# Patient Record
Sex: Female | Born: 2000
Health system: Southern US, Community
[De-identification: ages and names within clinical notes are randomized; demographics above are authoritative.]

## PROBLEM LIST (undated history)

## (undated) DIAGNOSIS — R51 Headache: Secondary | ICD-10-CM

## (undated) DIAGNOSIS — G479 Sleep disorder, unspecified: Secondary | ICD-10-CM

## (undated) DIAGNOSIS — F32A Depression, unspecified: Secondary | ICD-10-CM

## (undated) DIAGNOSIS — F450 Somatization disorder: Secondary | ICD-10-CM

## (undated) DIAGNOSIS — J309 Allergic rhinitis, unspecified: Secondary | ICD-10-CM

## (undated) DIAGNOSIS — K5900 Constipation, unspecified: Secondary | ICD-10-CM

## (undated) DIAGNOSIS — R519 Headache, unspecified: Secondary | ICD-10-CM

## (undated) DIAGNOSIS — F329 Major depressive disorder, single episode, unspecified: Secondary | ICD-10-CM

## (undated) DIAGNOSIS — L209 Atopic dermatitis, unspecified: Secondary | ICD-10-CM

## (undated) DIAGNOSIS — F419 Anxiety disorder, unspecified: Secondary | ICD-10-CM

## (undated) HISTORY — DX: Atopic dermatitis, unspecified: L20.9

## (undated) HISTORY — DX: Allergic rhinitis, unspecified: J30.9

## (undated) HISTORY — DX: Anxiety disorder, unspecified: F41.9

## (undated) HISTORY — DX: Major depressive disorder, single episode, unspecified: F32.9

## (undated) HISTORY — PX: TONSILLECTOMY: SUR1361

## (undated) HISTORY — DX: Headache, unspecified: R51.9

## (undated) HISTORY — DX: Sleep disorder, unspecified: G47.9

## (undated) HISTORY — DX: Headache: R51

## (undated) HISTORY — DX: Depression, unspecified: F32.A

## (undated) HISTORY — DX: Constipation, unspecified: K59.00

## (undated) HISTORY — DX: Somatization disorder: F45.0

---
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
## 2018-01-07 ENCOUNTER — Ambulatory Visit (INDEPENDENT_AMBULATORY_CARE_PROVIDER_SITE_OTHER): Payer: BLUE CROSS/BLUE SHIELD | Admitting: Clinical

## 2018-01-07 DIAGNOSIS — F4011 Social phobia, generalized: Secondary | ICD-10-CM | POA: Diagnosis not present

## 2018-01-20 ENCOUNTER — Ambulatory Visit (INDEPENDENT_AMBULATORY_CARE_PROVIDER_SITE_OTHER): Payer: BLUE CROSS/BLUE SHIELD | Admitting: Clinical

## 2018-01-20 DIAGNOSIS — F4011 Social phobia, generalized: Secondary | ICD-10-CM

## 2018-02-04 ENCOUNTER — Ambulatory Visit (INDEPENDENT_AMBULATORY_CARE_PROVIDER_SITE_OTHER): Payer: BLUE CROSS/BLUE SHIELD | Admitting: Clinical

## 2018-02-04 ENCOUNTER — Encounter: Payer: Self-pay | Admitting: Family Medicine

## 2018-02-04 ENCOUNTER — Ambulatory Visit (INDEPENDENT_AMBULATORY_CARE_PROVIDER_SITE_OTHER): Payer: BLUE CROSS/BLUE SHIELD | Admitting: Family Medicine

## 2018-02-04 VITALS — BP 133/82 | HR 56 | Temp 98.5°F | Resp 20 | Ht 65.0 in | Wt 115.0 lb

## 2018-02-04 DIAGNOSIS — F411 Generalized anxiety disorder: Secondary | ICD-10-CM

## 2018-02-04 DIAGNOSIS — F4011 Social phobia, generalized: Secondary | ICD-10-CM | POA: Diagnosis not present

## 2018-02-04 DIAGNOSIS — F1290 Cannabis use, unspecified, uncomplicated: Secondary | ICD-10-CM

## 2018-02-04 MED ORDER — SERTRALINE HCL 100 MG PO TABS: 200.0000 mg | ORAL_TABLET | Freq: Every day | ORAL | 1 refills | Status: DC

## 2018-02-04 NOTE — Patient Instructions (Signed)
I am glad you are doing well.  Follow-up in 6 months. Make sure to read the handouts. Follow-up if you have any questions or concerns.   Have fun at the Wedding!!   Generalized Anxiety Disorder, Adult Generalized anxiety disorder (GAD) is a mental health disorder. People with this condition constantly worry about everyday events. Unlike normal anxiety, worry related to GAD is not triggered by a specific event. These worries also do not fade or get better with time. GAD interferes with life functions, including relationships, work, and school. GAD can vary from mild to severe. People with severe GAD can have intense waves of anxiety with physical symptoms (panic attacks). What are the causes? The exact cause of GAD is not known. What increases the risk? This condition is more likely to develop in:  Women.  People who have a family history of anxiety disorders.  People who are very shy.  People who experience very stressful life events, such as the death of a loved one.  People who have a very stressful family environment.  What are the signs or symptoms? People with GAD often worry excessively about many things in their lives, such as their health and family. They may also be overly concerned about:  Doing well at work.  Being on time.  Natural disasters.  Friendships.  Physical symptoms of GAD include:  Fatigue.  Muscle tension or having muscle twitches.  Trembling or feeling shaky.  Being easily startled.  Feeling like your heart is pounding or racing.  Feeling out of breath or like you cannot take a deep breath.  Having trouble falling asleep or staying asleep.  Sweating.  Nausea, diarrhea, or irritable bowel syndrome (IBS).  Headaches.  Trouble concentrating or remembering facts.  Restlessness.  Irritability.  How is this diagnosed? Your health care provider can diagnose GAD based on your symptoms and medical history. You will also have a physical  exam. The health care provider will ask specific questions about your symptoms, including how severe they are, when they started, and if they come and go. Your health care provider may ask you about your use of alcohol or drugs, including prescription medicines. Your health care provider may refer you to a mental health specialist for further evaluation. Your health care provider will do a thorough examination and may perform additional tests to rule out other possible causes of your symptoms. To be diagnosed with GAD, a person must have anxiety that:  Is out of his or her control.  Affects several different aspects of his or her life, such as work and relationships.  Causes distress that makes him or her unable to take part in normal activities.  Includes at least three physical symptoms of GAD, such as restlessness, fatigue, trouble concentrating, irritability, muscle tension, or sleep problems.  Before your health care provider can confirm a diagnosis of GAD, these symptoms must be present more days than they are not, and they must last for six months or longer. How is this treated? The following therapies are usually used to treat GAD:  Medicine. Antidepressant medicine is usually prescribed for long-term daily control. Antianxiety medicines may be added in severe cases, especially when panic attacks occur.  Talk therapy (psychotherapy). Certain types of talk therapy can be helpful in treating GAD by providing support, education, and guidance. Options include: ? Cognitive behavioral therapy (CBT). People learn coping skills and techniques to ease their anxiety. They learn to identify unrealistic or negative thoughts and behaviors and to replace  them with positive ones. ? Acceptance and commitment therapy (ACT). This treatment teaches people how to be mindful as a way to cope with unwanted thoughts and feelings. ? Biofeedback. This process trains you to manage your body's response  (physiological response) through breathing techniques and relaxation methods. You will work with a therapist while machines are used to monitor your physical symptoms.  Stress management techniques. These include yoga, meditation, and exercise.  A mental health specialist can help determine which treatment is best for you. Some people see improvement with one type of therapy. However, other people require a combination of therapies. Follow these instructions at home:  Take over-the-counter and prescription medicines only as told by your health care provider.  Try to maintain a normal routine.  Try to anticipate stressful situations and allow extra time to manage them.  Practice any stress management or self-calming techniques as taught by your health care provider.  Do not punish yourself for setbacks or for not making progress.  Try to recognize your accomplishments, even if they are small.  Keep all follow-up visits as told by your health care provider. This is important. Contact a health care provider if:  Your symptoms do not get better.  Your symptoms get worse.  You have signs of depression, such as: ? A persistently sad, cranky, or irritable mood. ? Loss of enjoyment in activities that used to bring you joy. ? Change in weight or eating. ? Changes in sleeping habits. ? Avoiding friends or family members. ? Loss of energy for normal tasks. ? Feelings of guilt or worthlessness. Get help right away if:  You have serious thoughts about hurting yourself or others. If you ever feel like you may hurt yourself or others, or have thoughts about taking your own life, get help right away. You can go to your nearest emergency department or call:  Your local emergency services (911 in the [place]  A suicide crisis helpline, such as the National Suicide Prevention Lifeline at 404-470-30791-223 114 6077. This is open 24 hours a day.  Summary  Generalized anxiety disorder (GAD) is a mental  health disorder that involves worry that is not triggered by a specific event.  People with GAD often worry excessively about many things in their lives, such as their health and family.  GAD may cause physical symptoms such as restlessness, trouble concentrating, sleep problems, frequent sweating, nausea, diarrhea, headaches, and trembling or muscle twitching.  A mental health specialist can help determine which treatment is best for you. Some people see improvement with one type of therapy. However, other people require a combination of therapies. This information is not intended to replace advice given to you by your health care provider. Make sure you discuss any questions you have with your health care provider. Document Released: 02/08/2013 Document Revised: 09/03/2016 Document Reviewed: 09/03/2016 Elsevier Interactive Patient Education  [employer]2018 [employer]

## 2018-02-04 NOTE — Progress Notes (Signed)
Patricia Willis , [birth year], 17 y.o., female MRN: [id number] Patient Care Team    Relationship Specialty Notifications Start End  Natalia Leatherwood, DO PCP - General Family Medicine  06/01/15     Chief Complaint  Patient presents with  . Anxiety     Subjective:  Anxiety:  Patient presents today for follow-up on her anxiety. She is doing rather well. She is seeing Dr. Gery Pray for counseling. She wants [place] to be aware she has been smoking mariajuana daily. She is doing well on Zoloft 200 mg Qd. She is having no side effects.  She reports she is "terrified " to try and get another job. She currently works for her parents. She has a great deal of social phobia and isn't active outside of work.   Prior note:  Presents for follow-up on her anxiety today. She states that she has been taking the 150 mg of Zoloft daily. She denies any negative side effects from the medication. She reports increased anxiety currently secondary to a relationship. She has been dating a young man for approximately one year, and she is having problems in that relationship. She has also graduated early this year in December, but will not walk with her class until June. She currently does not have a job and is deciding if she wants to attend college. She states she is not a "school type "of person and really does not want to attend college. She also states she would like to be involved in exercise science/physiology.  Depression screen [facility] 2/9 09/15/2017 02/11/2017  Decreased Interest 1 1  Down, Depressed, Hopeless 3 3  PHQ - 2 Score 4 4  Altered sleeping 0 3  Tired, decreased energy 1 3  Change in appetite 1 1  Feeling bad or failure about yourself  2 3  Trouble concentrating 0 0  Moving slowly or fidgety/restless 0 0  Suicidal thoughts 0 0  PHQ-9 Score 8 14  Difficult doing work/chores - Somewhat difficult    Allergies  Allergen Reactions  . Keflex [Cephalexin]    Social History   Tobacco Use  . Smoking  status: Never Smoker  . Smokeless tobacco: Never Used  Substance Use Topics  . Alcohol use: No   Past Medical History:  Diagnosis Date  . Allergic rhinitis   . Anxiety with somatization   . Atopic dermatitis   . Constipation   . Depression   . Headache   . Sleep disturbances    Past Surgical History:  Procedure Laterality Date  . TONSILLECTOMY     Family History  Problem Relation Age of Onset  . Hypertension Maternal Grandmother    Allergies as of 02/04/2018      Reactions   Keflex [cephalexin]       Medication List        Accurate as of 02/04/18  2:19 PM. Always use your most recent med list.          CRYSELLE-28 0.3-30 MG-MCG tablet Generic drug:  norgestrel-ethinyl estradiol   gabapentin 300 MG capsule Commonly known as:  NEURONTIN Take 600 mg by mouth at bedtime.   hydrocortisone 2.5 % cream Apply topically 2 (two) times daily as needed.   magnesium oxide 400 MG tablet Commonly known as:  MAG-OX Take 400 mg by mouth.   sertraline 100 MG tablet Commonly known as:  ZOLOFT Take 2 tablets (200 mg total) by mouth daily. Need office visit prior to anymore refills.  All past medical history, surgical history, allergies, family history, immunizations andmedications were updated in the EMR today and reviewed under the history and medication portions of their EMR.     ROS: Negative, with the exception of above mentioned in HPI   Objective:  BP 133/82 (BP Location: Left Arm, Patient Position: Sitting, Cuff Size: Normal)   Pulse (!) 56   Temp 98.5 F (36.9 C)   Resp 20   Ht 5\' 5"  (1.651 m)   Wt 115 lb (52.2 kg)   LMP 01/27/2018   SpO2 98%   BMI 19.14 kg/m  Body mass index is 19.14 kg/m. Gen: Afebrile. No acute distress. Nontoxic in appearance, well developed, well nourished.  HENT: AT. [place]. MMM, no oral lesions.  Eyes:Pupils Equal Round Reactive to light, Extraocular movements intact,  Conjunctiva without redness, discharge or icterus. CV: RRR    Chest: CTAB, no wheeze or crackles.  Abd: Soft. NTND. BS present.  Neuro:  Normal gait. PERLA. EOMi. Alert. Oriented x3  Psych: Normal affect, dress and demeanor. Normal speech. Normal thought content and judgment.  No exam data present No results found. No results found for this or any previous visit (from the past 24 hour(s)).  Assessment/Plan: Patricia Willis is a 17 y.o. female present for OV for  Generalized anxiety disorder - Continue therapy with  Dr. Dewayne HatchMendelson  - Continue Zoloft 200 mg daily, refilled today - education provided on 2 handouts from outside sources concerning marijuana use/abuse and potential SE with Zoloft. She has been using for some time and has been without SE.  - Follow-up on 6 months on anxiety as long as doing well, sooner if needing anything.   Reviewed expectations re: course of current medical issues.  Discussed self-management of symptoms.  Outlined signs and symptoms indicating need for more acute intervention.  Patient verbalized understanding and all questions were answered.  Patient received an After-Visit Summary.    No orders of the defined types were placed in this encounter.    Note is dictated utilizing voice recognition software. Although note has been proof read prior to signing, occasional typographical errors still can be missed. If any questions arise, please do not hesitate to call for verification.   electronically signed by:  Felix Pacinienee Ivar Domangue, DO  [facility] [place]

## 2018-03-18 ENCOUNTER — Ambulatory Visit (INDEPENDENT_AMBULATORY_CARE_PROVIDER_SITE_OTHER): Payer: BLUE CROSS/BLUE SHIELD | Admitting: Clinical

## 2018-03-18 DIAGNOSIS — F4011 Social phobia, generalized: Secondary | ICD-10-CM

## 2018-07-30 ENCOUNTER — Other Ambulatory Visit: Payer: Self-pay | Admitting: *Deleted

## 2018-07-30 DIAGNOSIS — F411 Generalized anxiety disorder: Secondary | ICD-10-CM

## 2018-07-30 MED ORDER — SERTRALINE HCL 100 MG PO TABS: 200.0000 mg | ORAL_TABLET | Freq: Every day | ORAL | 0 refills | Status: DC

## 2018-08-27 DIAGNOSIS — Z01419 Encounter for gynecological examination (general) (routine) without abnormal findings: Secondary | ICD-10-CM | POA: Diagnosis not present

## 2018-08-27 DIAGNOSIS — Z113 Encounter for screening for infections with a predominantly sexual mode of transmission: Secondary | ICD-10-CM | POA: Diagnosis not present

## 2018-09-29 ENCOUNTER — Telehealth: Payer: Self-pay

## 2018-09-29 NOTE — Telephone Encounter (Signed)
Message left on voice mail for patient to return call to schedule an appointment for follow up on Sertraline.

## 2018-10-06 ENCOUNTER — Telehealth: Payer: Self-pay

## 2018-10-06 ENCOUNTER — Other Ambulatory Visit: Payer: Self-pay

## 2018-10-06 DIAGNOSIS — F411 Generalized anxiety disorder: Secondary | ICD-10-CM

## 2018-10-06 MED ORDER — SERTRALINE HCL 100 MG PO TABS: 200.0000 mg | ORAL_TABLET | Freq: Every day | ORAL | 0 refills | Status: DC

## 2018-10-06 NOTE — Telephone Encounter (Signed)
Medication sent into pharmacy  

## 2018-10-06 NOTE — Telephone Encounter (Signed)
Copied from CRM [id number]. Topic: General - Other >> Oct 06, 2018 11:05 AM Percival SpanishKennedy, Cheryl W wrote:  Mom call to say she spoke with Ladona Ridgelaylor and wanted to let her know that Dorathy DaftKayla will run out of her medication before her appt on 09/1318 and is asking that a refill be called in    sertraline (ZOLOFT) 100 MG tablet     [facility]

## 2018-10-06 NOTE — Telephone Encounter (Signed)
Spoke to mother and she was notified that we would send in enough medication to get her to her scheduled appt. 10/09/18.

## 2018-10-09 ENCOUNTER — Ambulatory Visit (INDEPENDENT_AMBULATORY_CARE_PROVIDER_SITE_OTHER): Payer: BLUE CROSS/BLUE SHIELD | Admitting: Family Medicine

## 2018-10-09 ENCOUNTER — Encounter: Payer: Self-pay | Admitting: Family Medicine

## 2018-10-09 DIAGNOSIS — F411 Generalized anxiety disorder: Secondary | ICD-10-CM | POA: Diagnosis not present

## 2018-10-09 MED ORDER — SERTRALINE HCL 100 MG PO TABS: 200.0000 mg | ORAL_TABLET | Freq: Every day | ORAL | 1 refills | Status: DC

## 2018-10-09 NOTE — Patient Instructions (Signed)
I am glad you are doing well. I have refilled your meds for 6 months total. Follow up before needing refills.  Bring some sunshine back from your cruise for Patricia Willis.    Please help Patricia Willis help you:  We are honored [facility]. Below you will find basic instructions that you may need to access in the future. Please help Patricia Willis help you by reading the instructions, which cover many of the frequent questions we experience.   Prescription refills and request:  -In order to allow more efficient response time, please call your pharmacy for all refills. They will forward the request electronically to Patricia Willis. This allows for the quickest possible response. Request left on a nurse line can take longer to refill, since these are checked as time allows between office patients and other phone calls.  - refill request can take up to 3-5 working days to complete.  - If request is sent electronically and request is appropiate, it is usually completed in 1-2 business days.  - all patients will need to be seen routinely for all chronic medical conditions requiring prescription medications (see follow-up below). If you are overdue for follow up on your condition, you will be asked to make an appointment and we will call in enough medication to cover you until your appointment (up to 30 days).  - all controlled substances will require a face to face visit to request/refill.  - if you desire your prescriptions to go through a new pharmacy, and have an active script at original pharmacy, you will need to call your pharmacy and have scripts transferred to new pharmacy. This is completed between the pharmacy locations and not by your provider.    Results: If any images or labs were ordered, it can take up to 1 week to get results depending on the test ordered and the lab/facility running and resulting the test. - Normal or stable results, which do not need further discussion, may be  released to your mychart immediately with attached note to you. A call may not be generated for normal results. Please make certain to sign up for mychart. If you have questions on how to activate your mychart you can call the front office.  - If your results need further discussion, our office will attempt to contact you via phone, and if unable to reach you after 2 attempts, we will release your abnormal result to your mychart with instructions.  - All results will be automatically released in mychart after 1 week.  - Your provider will provide you with explanation and instruction on all relevant material in your results. Please keep in mind, results and labs may appear confusing or abnormal to the untrained eye, but it does not mean they are actually abnormal for you personally. If you have any questions about your results that are not covered, or you desire more detailed explanation than what was provided, you should make an appointment with your provider to do so.   Our office handles many outgoing and incoming calls daily. If we have not contacted you within 1 week about your results, please check your mychart to see if there is a message first and if not, then contact our office.  In helping with this matter, you help decrease call volume, and therefore allow Patricia Willis to be able to respond to patients needs more efficiently.   Acute office visits (sick visit):  An acute visit is intended for a  new problem and are scheduled in shorter time slots to allow schedule openings for patients with new problems. This is the appropriate visit to discuss a new problem. Problems will not be addressed by phone call or Echart message. Appointment is needed if requesting treatment. In order to provide you with excellent quality medical care with proper time for you to explain your problem, have an exam and receive treatment with instructions, these appointments should be limited to one new problem per visit. If you  experience a new problem, in which you desire to be addressed, please make an acute office visit, we save openings on the schedule to accommodate you. Please do not save your new problem for any other type of visit, let us take care of it properly and quickly for you.   Follow up visits:  Depending on your condition(s) your provider will need to see you routinely in order to provide you with quality care and prescribe medication(s). Most chronic conditions (Example: hypertension, Diabetes, depression/anxiety... etc), require visits a couple times a year. Your provider will instruct you on proper follow up for your personal medical conditions and history. Please make certain to make follow up appointments for your condition as instructed. Failing to do so could result in lapse in your medication treatment/refills. If you request a refill, and are overdue to be seen on a condition, we will always provide you with a 30 day script (once) to allow you time to schedule.    Medicare wellness (well visit): - we have a wonderful Nurse Selena Batten), that will meet with you and provide you will yearly medicare wellness visits. These visits should occur yearly (can not be scheduled less than 1 calendar year apart) and cover preventive health, immunizations, advance directives and screenings you are entitled to yearly through your medicare benefits. Do not miss out on your entitled benefits, this is when medicare will pay for these benefits to be ordered for you.  These are strongly encouraged by your provider and is the appropriate type of visit to make certain you are up to date with all preventive health benefits. If you have not had your medicare wellness exam in the last 12 months, please make certain to schedule one by calling the office and schedule your medicare wellness with Selena Batten as soon as possible.   Yearly physical (well visit):  - Adults are recommended to be seen yearly for physicals. Check with your insurance  and date of your last physical, most insurances require one calendar year between physicals. Physicals include all preventive health topics, screenings, medical exam and labs that are appropriate for gender/age and history. You may have fasting labs needed at this visit. This is a well visit (not a sick visit), new problems should not be covered during this visit (see acute visit).  - Pediatric patients are seen more frequently when they are younger. Your provider will advise you on well child visit timing that is appropriate for your their age. - This is not a medicare wellness visit. Medicare wellness exams do not have an exam portion to the visit. Some medicare companies allow for a physical, some do not allow a yearly physical. If your medicare allows a yearly physical you can schedule the medicare wellness with our nurse Selena Batten and have your physical with your provider after, on the same day. Please check with insurance for your full benefits.   Late Policy/No Shows:  - all new patients should arrive 15-30 minutes earlier than appointment to allow  [place] time  to  obtain all personal demographics,  insurance information and for you to complete office paperwork. - All established patients should arrive 10-15 minutes earlier than appointment time to update all information and be checked in .  - In our best efforts to run on time, if you are late for your appointment you will be asked to either reschedule or if able, we will work you back into the schedule. There will be a wait time to work you back in the schedule,  depending on availability.  - If you are unable to make it to your appointment as scheduled, please call 24 hours ahead of time to allow [place] to fill the time slot with someone else who needs to be seen. If you do not cancel your appointment ahead of time, you may be charged a no show fee.

## 2018-10-09 NOTE — Progress Notes (Signed)
Patricia Willis , 26-Mar-2001, 17 y.o., female MRN: [id number] Patient Care Team    Relationship Specialty Notifications Start End  Natalia Leatherwood, DO PCP - General Family Medicine  06/01/15     Chief Complaint  Patient presents with  . Follow-up    [facility]     Subjective:  Anxiety:  Pt reports she is doing well. She is compliant with the zoloft 200 mg QD. She is no longer taking the gabapentin. She is excited for the Holidays and is going on a cruise with her family over Christmas  Prior note:  Patient presents today for follow-up on her anxiety. She is doing rather well. She is seeing Dr. Gery Pray for counseling. She wants [place] to be aware she has been smoking mariajuana daily. She is doing well on Zoloft 200 mg Qd. She is having no side effects.  She reports she is "terrified " to try and get another job. She currently works for her parents. She has a great deal of social phobia and isn't active outside of work.   Prior note:  Presents for follow-up on her anxiety today. She states that she has been taking the 150 mg of Zoloft daily. She denies any negative side effects from the medication. She reports increased anxiety currently secondary to a relationship. She has been dating a young man for approximately one year, and she is having problems in that relationship. She has also graduated early this year in December, but will not walk with her class until June. She currently does not have a job and is deciding if she wants to attend college. She states she is not a "school type "of person and really does not want to attend college. She also states she would like to be involved in exercise science/physiology.  Depression screen [facility] 2/9 10/09/2018 09/15/2017 02/11/2017  Decreased Interest 1 1 1   Down, Depressed, Hopeless 0 3 3  PHQ - 2 Score 1 4 4   Altered sleeping 0 0 3  Tired, decreased energy 2 1 3   Change in appetite 2 1 1   Feeling bad or failure about yourself  0 2 3  Trouble  concentrating 0 0 0  Moving slowly or fidgety/restless 1 0 0  Suicidal thoughts 0 0 0  PHQ-9 Score 6 8 14   Difficult doing work/chores Not difficult at all - Somewhat difficult   GAD 7 : Generalized Anxiety Score 10/09/2018 02/04/2018 09/15/2017 02/11/2017  Nervous, Anxious, on Edge 1 1 2 2   Control/stop worrying 0 0 2 3  Worry too much - different things 1 0 2 2  Trouble relaxing 1 0 2 3  Restless 1 1 1 1   Easily annoyed or irritable 2 1 3 3   Afraid - awful might happen 0 0 0 1  Total GAD 7 Score 6 3 12 15   Anxiety Difficulty Not difficult at all - - Not difficult at all    Allergies  Allergen Reactions  . Keflex [Cephalexin]    Social History   Tobacco Use  . Smoking status: Never Smoker  . Smokeless tobacco: Never Used  Substance Use Topics  . Alcohol use: No   Past Medical History:  Diagnosis Date  . Allergic rhinitis   . Anxiety with somatization   . Atopic dermatitis   . Constipation   . Depression   . Headache   . Sleep disturbances    Past Surgical History:  Procedure Laterality Date  . TONSILLECTOMY     Family History  Problem Relation Age of Onset  . Hypertension Maternal Grandmother    Allergies as of 10/09/2018      Reactions   Keflex [cephalexin]       Medication List       Accurate as of October 09, 2018  9:53 AM. Always use your most recent med list.        CRYSELLE-28 0.3-30 MG-MCG tablet Generic drug:  norgestrel-ethinyl estradiol   magnesium oxide 400 MG tablet Commonly known as:  MAG-OX Take 400 mg by mouth.       All past medical history, surgical history, allergies, family history, immunizations andmedications were updated in the EMR today and reviewed under the history and medication portions of their EMR.     ROS: Negative, with the exception of above mentioned in HPI   Objective:  BP 133/89 (BP Location: Left Arm, Patient Position: Sitting, Cuff Size: Normal)   Pulse 69   Temp 98.5 F (36.9 C) (Oral)   Resp 16    Ht 5\' 5"  (1.651 m)   Wt 115 lb (52.2 kg)   SpO2 100%   BMI 19.14 kg/m  Body mass index is 19.14 kg/m. Gen: Afebrile. No acute distress. Pleasant.  HENT: AT. [place].  MMM.  Eyes:Pupils Equal Round Reactive to light, Extraocular movements intact,  Conjunctiva without redness, discharge or icterus. CV: RRR  Chest: CTAB, no wheeze or crackles Neuro:  Normal gait. PERLA. EOMi. Alert. Oriented x3  Psych: Normal affect, dress and demeanor. Normal speech. Normal thought content and judgment.   No exam data present No results found. No results found for this or any previous visit (from the past 24 hour(s)).  Assessment/Plan: Patricia Willis is a 17 y.o. female present for OV for  Generalized anxiety disorder - stable. Continue Zoloft 200 mg daily, refilled today - Follow-up on 6 months on anxiety as long as doing well, sooner if needing anything.   Reviewed expectations re: course of current medical issues.  Discussed self-management of symptoms.  Outlined signs and symptoms indicating need for more acute intervention.  Patient verbalized understanding and all questions were answered.  Patient received an After-Visit Summary.   > 15 minutes spent with patient, >50% of time spent face to face counseling     No orders of the defined types were placed in this encounter.    Note is dictated utilizing voice recognition software. Although note has been proof read prior to signing, occasional typographical errors still can be missed. If any questions arise, please do not hesitate to call for verification.   electronically signed by:  Felix Pacinienee Kuneff, DO  [facility] [place]

## 2019-01-16 ENCOUNTER — Other Ambulatory Visit: Payer: Self-pay | Admitting: Family Medicine

## 2019-01-16 DIAGNOSIS — L209 Atopic dermatitis, unspecified: Secondary | ICD-10-CM

## 2019-03-02 ENCOUNTER — Other Ambulatory Visit: Payer: Self-pay | Admitting: Family Medicine

## 2019-03-02 DIAGNOSIS — F411 Generalized anxiety disorder: Secondary | ICD-10-CM

## 2019-03-02 NOTE — Telephone Encounter (Signed)
Received request for refill on zoloft.  She is due for her f/u appt end of May. Please schedule her for her 6 mos f/u asap.

## 2019-03-02 NOTE — Telephone Encounter (Signed)
Pt was called and detailed message was left that pt would need to call and make appt for virtual visit in order to get refills on medications and she is due for her [facility] appt. Pt advised to call back, number was left for patient

## 2019-03-04 ENCOUNTER — Other Ambulatory Visit: Payer: Self-pay

## 2019-03-04 ENCOUNTER — Encounter: Payer: Self-pay | Admitting: Family Medicine

## 2019-03-04 ENCOUNTER — Ambulatory Visit (INDEPENDENT_AMBULATORY_CARE_PROVIDER_SITE_OTHER): Payer: BLUE CROSS/BLUE SHIELD | Admitting: Family Medicine

## 2019-03-04 VITALS — Temp 98.3°F | Ht 65.0 in | Wt 118.0 lb

## 2019-03-04 DIAGNOSIS — F411 Generalized anxiety disorder: Secondary | ICD-10-CM | POA: Diagnosis not present

## 2019-03-04 MED ORDER — SERTRALINE HCL 100 MG PO TABS: 200.0000 mg | ORAL_TABLET | Freq: Every day | ORAL | 1 refills | Status: DC

## 2019-03-04 NOTE — Progress Notes (Signed)
VIRTUAL VISIT VIA VIDEO  I connected with Patricia Willis on 03/04/19 at 10:00 AM EDT by a video enabled telemedicine application and verified that I am speaking with the correct person using two identifiers. Location patient: Home Location provider: [facility], Office Persons participating in the virtual visit: Patient, Dr. Claiborne Billings and R.Baker, LPN  I discussed the limitations of evaluation and management by telemedicine and the availability of in person appointments. The patient expressed understanding and agreed to proceed.   SUBJECTIVE Chief Complaint  Patient presents with  . Anxiety    Pt is doing well on medication and needs refills     HPI:  Anxiety:  Patricia Willis is doing rather well.  She is compliant with 200 mg of Zoloft daily.  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 prior note: Pt reports she is doing well. She is compliant with the zoloft 200 mg QD. She is no longer taking the gabapentin. She is excited for the Holidays and is going on a cruise with her family over Christmas  Prior note:  Patient presents today for follow-up on her anxiety. She is doing rather well. She is seeing Dr. Gery Pray for counseling. She wants [place] to be aware she has been smoking mariajuana daily. She is doing well on Zoloft 200 mg Qd. She is having no side effects.  She reports she is "terrified " to try and get another job. She currently works for her parents. She has a great deal of social phobia and isn't active outside of work.  Prior note:  Presents for follow-up on her anxiety today. She states that she has been taking the 150 mg of Zoloft daily. She denies any negative side effects from the medication. She reports increased anxiety currently secondary to a relationship. She has been dating a young man for approximately one year, and she is having problems in that relationship. She has also graduated early this year in December, but will not walk with her  class until June. She currently does not have a job and is deciding if she wants to attend college. She states she is not a "school type "of person and really does not want to attend college. She also states she would like to be involved in exercise science/physiology. ROS: See pertinent positives and negatives per HPI.  Patient Active Problem List   Diagnosis Date Noted  . Marijuana use 02/04/2018  . Atopic dermatitis 07/24/2015  . Counseling for birth control, oral contraceptives 06/12/2015  . Chronic headaches 06/01/2015  . Generalized anxiety disorder 06/01/2015    Social History   Tobacco Use  . Smoking status: Never Smoker  . Smokeless tobacco: Never Used  Substance Use Topics  . Alcohol use: No    Current Outpatient Medications:  .  CRYSELLE-28 0.3-30 MG-MCG tablet, , Disp: , Rfl:  .  hydrocortisone 2.5 % cream, APPLY TOPICALLY 2 (TWO) TIMES DAILY AS NEEDED., Disp: 28.35 g, Rfl: 1 .  magnesium oxide (MAG-OX) 400 MG tablet, Take 400 mg by mouth., Disp: , Rfl:  .  sertraline (ZOLOFT) 100 MG tablet, Take 2 tablets (200 mg total) by mouth daily. Need office visit prior to anymore refills., Disp: 180 tablet, Rfl: 1  Allergies  Allergen Reactions  . Keflex [Cephalexin]     OBJECTIVE: Temp 98.3 F (36.8 C) (Oral)   Ht 5\' 5"  (1.651 m)   Wt 118 lb (53.5 kg)   LMP 02/16/2019 (Exact Date)   BMI  19.64 kg/m  Gen: No acute distress. Nontoxic in appearance.  HENT: AT. [place].  MMM.  Eyes:Pupils Equal Round Reactive to light, Extraocular movements intact,  Conjunctiva without redness, discharge or icterus. Chest: Cough or shortness of breath not present today.  Neuro: . Alert. Oriented x3  Psych: Normal affect, dress and demeanor. Normal speech. Normal thought content and judgment.  [facility] 03/04/2019 10/09/2018 09/15/2017 02/11/2017  Decreased Interest 0 1 1 1   Down, Depressed, Hopeless 0 0 3 3  PHQ - 2 Score 0 1 4 4   Altered sleeping - 0 0 3  Tired, decreased  energy - 2 1 3   Change in appetite - 2 1 1   Feeling bad or failure about yourself  - 0 2 3  Trouble concentrating - 0 0 0  Moving slowly or fidgety/restless - 1 0 0  Suicidal thoughts - 0 0 0  PHQ-9 Score - 6 8 14   Difficult doing work/chores - Not difficult at all - Somewhat difficult   GAD 7 : Generalized Anxiety Score 03/04/2019 10/09/2018 02/04/2018 09/15/2017  Nervous, Anxious, on Edge 0 1 1 2   Control/stop worrying 0 0 0 2  Worry too much - different things 1 1 0 2  Trouble relaxing 0 1 0 2  Restless 1 1 1 1   Easily annoyed or irritable 1 2 1 3   Afraid - awful might happen 0 0 0 0  Total GAD 7 Score 3 6 3 12   Anxiety Difficulty Not difficult at all Not difficult at all - -    ASSESSMENT AND PLAN: Patricia Willis is a 18 y.o. female present for  Generalized anxiety disorder -Stable. Doing rather well.  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 Practicing Coronavirus precautions.  - Follow-up on 6 months on anxiety as long as doing well, sooner if needing anything.  > 15 minutes spent with patient, >50% of time spent face to face counseling    Felix PaciniRenee , DO 03/04/2019

## 2019-03-04 NOTE — Telephone Encounter (Signed)
Appt was scheduled for 03/04/2019

## 2019-08-25 ENCOUNTER — Encounter: Payer: Self-pay | Admitting: Family Medicine

## 2019-08-25 ENCOUNTER — Other Ambulatory Visit: Payer: Self-pay

## 2019-08-25 ENCOUNTER — Ambulatory Visit (INDEPENDENT_AMBULATORY_CARE_PROVIDER_SITE_OTHER): Payer: BC Managed Care – PPO | Admitting: Family Medicine

## 2019-08-25 VITALS — BP 135/82 | HR 63 | Temp 99.8°F | Resp 16 | Ht 65.0 in | Wt 123.0 lb

## 2019-08-25 DIAGNOSIS — F411 Generalized anxiety disorder: Secondary | ICD-10-CM | POA: Diagnosis not present

## 2019-08-25 MED ORDER — SERTRALINE HCL 100 MG PO TABS: 200.0000 mg | ORAL_TABLET | Freq: Every day | ORAL | 1 refills | Status: DC

## 2019-08-25 NOTE — Progress Notes (Signed)
SUBJECTIVE Chief Complaint  Patient presents with  . Anxiety    patient needs a refill on medication    HPI: Patricia Willis is a 18 y.o. female present for  Anxiety:  Patricia Willis is doing rather well.  She is compliant with 200 mg of Zoloft daily.  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 Doing well. Feels happy and even states she "feels like she has come a long way" in improving her anxiety condition.   Prior note:  Presents for follow-up on her anxiety today. She states that she has been taking the 150 mg of Zoloft daily. She denies any negative side effects from the medication. She reports increased anxiety currently secondary to a relationship. She has been dating a young man for approximately one year, and she is having problems in that relationship. She has also graduated early this year in December, but will not walk with her class until June. She currently does not have a job and is deciding if she wants to attend college. She states she is not a "school type "of person and really does not want to attend college. She also states she would like to be involved in exercise science/physiology. ROS: See pertinent positives and negatives per HPI.  Patient Active Problem List   Diagnosis Date Noted  . Marijuana use 02/04/2018  . Atopic dermatitis 07/24/2015  . Counseling for birth control, oral contraceptives 06/12/2015  . Chronic headaches 06/01/2015  . Generalized anxiety disorder 06/01/2015    Social History   Tobacco Use  . Smoking status: Never Smoker  . Smokeless tobacco: Never Used  Substance Use Topics  . Alcohol use: No    Current Outpatient Medications:  .  hydrocortisone 2.5 % cream, APPLY TOPICALLY 2 (TWO) TIMES DAILY AS NEEDED., Disp: 28.35 g, Rfl: 1 .  magnesium oxide (MAG-OX) 400 MG tablet, Take 400 mg by mouth., Disp: , Rfl:  .  norgestrel-ethinyl estradiol (LO/OVRAL) 0.3-30 MG-MCG tablet, TAKE 1 TABLET BY MOUTH EVERY DAY, Disp: ,  Rfl:  .  sertraline (ZOLOFT) 100 MG tablet, Take 2 tablets (200 mg total) by mouth daily. Need office visit prior to anymore refills., Disp: 180 tablet, Rfl: 1 .  CRYSELLE-28 0.3-30 MG-MCG tablet, , Disp: , Rfl:   Allergies  Allergen Reactions  . Keflex [Cephalexin]     OBJECTIVE: BP 135/82 (BP Location: Left Arm, Patient Position: Sitting, Cuff Size: Normal)   Pulse 63   Temp 99.8 F (37.7 C) (Temporal)   Resp 16   Ht 5\' 5"  (1.651 m)   Wt 123 lb (55.8 kg)   LMP 08/01/2019 (Exact Date)   SpO2 97%   BMI 20.47 kg/m  Gen: Afebrile. No acute distress. Happy smiling.  HENT: AT. [place]. Eyes:Pupils Equal Round Reactive to light, Extraocular movements intact,  Conjunctiva without redness, discharge or icterus. CV: RRR no murmur, no edema, +2/4 P posterior tibialis pulses Chest: CTAB, no wheeze or crackles Neuro: Normal gait. PERLA. EOMi. Alert. Oriented. Psych: Normal affect, dress and demeanor. Normal speech. Normal thought content and judgment.  Depression screen [facility] 2/9 08/25/2019 03/04/2019 10/09/2018 09/15/2017 02/11/2017  Decreased Interest 1 0 1 1 1   Down, Depressed, Hopeless 0 0 0 3 3  PHQ - 2 Score 1 0 1 4 4   Altered sleeping - - 0 0 3  Tired, decreased energy - - 2 1 3   Change in appetite - - 2 1 1   Feeling bad or failure about yourself  - -  0 2 3  Trouble concentrating - - 0 0 0  Moving slowly or fidgety/restless - - 1 0 0  Suicidal thoughts - - 0 0 0  PHQ-9 Score - - 6 8 14   Difficult doing work/chores - - Not difficult at all - Somewhat difficult   GAD 7 : Generalized Anxiety Score 08/25/2019 03/04/2019 10/09/2018 02/04/2018  Nervous, Anxious, on Edge 0 0 1 1  Control/stop worrying 0 0 0 0  Worry too much - different things 1 1 1  0  Trouble relaxing 1 0 1 0  Restless 0 1 1 1   Easily annoyed or irritable 2 1 2 1   Afraid - awful might happen 0 0 0 0  Total GAD 7 Score 4 3 6 3   Anxiety Difficulty - Not difficult at all Not difficult at all -    ASSESSMENT AND PLAN:  Patricia Willis is a 18 y.o. female present for  Generalized anxiety disorder -Stable. doing well.  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 Practicing Coronavirus precautions.  - Follow-up on 6 months on anxiety as long as doing well, sooner if needing anything. (This appt can be her CPE if she desires)  > 15 minutes spent with patient, >50% of time spent face to face counseling    , DO 08/25/2019

## 2019-08-25 NOTE — Patient Instructions (Signed)
Nice to see you today.  I have refilled your medications for you.  Follow up in 6 months. They will call you to schedule.

## 2020-02-23 DIAGNOSIS — Z01419 Encounter for gynecological examination (general) (routine) without abnormal findings: Secondary | ICD-10-CM | POA: Diagnosis not present

## 2020-02-23 DIAGNOSIS — N926 Irregular menstruation, unspecified: Secondary | ICD-10-CM | POA: Diagnosis not present

## 2020-02-23 DIAGNOSIS — Z113 Encounter for screening for infections with a predominantly sexual mode of transmission: Secondary | ICD-10-CM | POA: Diagnosis not present

## 2020-02-23 DIAGNOSIS — Z793 Long term (current) use of hormonal contraceptives: Secondary | ICD-10-CM | POA: Diagnosis not present

## 2020-03-07 ENCOUNTER — Other Ambulatory Visit: Payer: Self-pay

## 2020-03-07 ENCOUNTER — Encounter: Payer: Self-pay | Admitting: Family Medicine

## 2020-03-07 ENCOUNTER — Ambulatory Visit (INDEPENDENT_AMBULATORY_CARE_PROVIDER_SITE_OTHER): Payer: BC Managed Care – PPO | Admitting: Family Medicine

## 2020-03-07 VITALS — BP 130/83 | HR 60 | Temp 98.3°F | Resp 16 | Ht 65.0 in | Wt 120.1 lb

## 2020-03-07 DIAGNOSIS — F411 Generalized anxiety disorder: Secondary | ICD-10-CM

## 2020-03-07 MED ORDER — SERTRALINE HCL 100 MG PO TABS: 200.0000 mg | ORAL_TABLET | Freq: Every day | ORAL | 1 refills | Status: DC

## 2020-03-07 NOTE — Patient Instructions (Addendum)
Great to see you today.  I have refilled your meds.  Follow up in 5.5 mos before running out of medications, sooner if needed.   Have a great Summer!!   Generalized Anxiety Disorder, Adult Generalized anxiety disorder (GAD) is a mental health disorder. People with this condition constantly worry about everyday events. Unlike normal anxiety, worry related to GAD is not triggered by a specific event. These worries also do not fade or get better with time. GAD interferes with life functions, including relationships, work, and school. GAD can vary from mild to severe. People with severe GAD can have intense waves of anxiety with physical symptoms (panic attacks). What are the causes? The exact cause of GAD is not known. What increases the risk? This condition is more likely to develop in:  Women.  People who have a family history of anxiety disorders.  People who are very shy.  People who experience very stressful life events, such as the death of a loved one.  People who have a very stressful family environment. What are the signs or symptoms? People with GAD often worry excessively about many things in their lives, such as their health and family. They may also be overly concerned about:  Doing well at work.  Being on time.  Natural disasters.  Friendships. Physical symptoms of GAD include:  Fatigue.  Muscle tension or having muscle twitches.  Trembling or feeling shaky.  Being easily startled.  Feeling like your heart is pounding or racing.  Feeling out of breath or like you cannot take a deep breath.  Having trouble falling asleep or staying asleep.  Sweating.  Nausea, diarrhea, or irritable bowel syndrome (IBS).  Headaches.  Trouble concentrating or remembering facts.  Restlessness.  Irritability. How is this diagnosed? Your health care provider can diagnose GAD based on your symptoms and medical history. You will also have a physical exam. The health  care provider will ask specific questions about your symptoms, including how severe they are, when they started, and if they come and go. Your health care provider may ask you about your use of alcohol or drugs, including prescription medicines. Your health care provider may refer you to a mental health specialist for further evaluation. Your health care provider will do a thorough examination and may perform additional tests to rule out other possible causes of your symptoms. To be diagnosed with GAD, a person must have anxiety that:  Is out of his or her control.  Affects several different aspects of his or her life, such as work and relationships.  Causes distress that makes him or her unable to take part in normal activities.  Includes at least three physical symptoms of GAD, such as restlessness, fatigue, trouble concentrating, irritability, muscle tension, or sleep problems. Before your health care provider can confirm a diagnosis of GAD, these symptoms must be present more days than they are not, and they must last for six months or longer. How is this treated? The following therapies are usually used to treat GAD:  Medicine. Antidepressant medicine is usually prescribed for long-term daily control. Antianxiety medicines may be added in severe cases, especially when panic attacks occur.  Talk therapy (psychotherapy). Certain types of talk therapy can be helpful in treating GAD by providing support, education, and guidance. Options include: ? Cognitive behavioral therapy (CBT). People learn coping skills and techniques to ease their anxiety. They learn to identify unrealistic or negative thoughts and behaviors and to replace them with positive ones. ? Acceptance  and commitment therapy (ACT). This treatment teaches people how to be mindful as a way to cope with unwanted thoughts and feelings. ? Biofeedback. This process trains you to manage your body's response (physiological response)  through breathing techniques and relaxation methods. You will work with a therapist while machines are used to monitor your physical symptoms.  Stress management techniques. These include yoga, meditation, and exercise. A mental health specialist can help determine which treatment is best for you. Some people see improvement with one type of therapy. However, other people require a combination of therapies. Follow these instructions at home:  Take over-the-counter and prescription medicines only as told by your health care provider.  Try to maintain a normal routine.  Try to anticipate stressful situations and allow extra time to manage them.  Practice any stress management or self-calming techniques as taught by your health care provider.  Do not punish yourself for setbacks or for not making progress.  Try to recognize your accomplishments, even if they are small.  Keep all follow-up visits as told by your health care provider. This is important. Contact a health care provider if:  Your symptoms do not get better.  Your symptoms get worse.  You have signs of depression, such as: ? A persistently sad, cranky, or irritable mood. ? Loss of enjoyment in activities that used to bring you joy. ? Change in weight or eating. ? Changes in sleeping habits. ? Avoiding friends or family members. ? Loss of energy for normal tasks. ? Feelings of guilt or worthlessness. Get help right away if:  You have serious thoughts about hurting yourself or others. If you ever feel like you may hurt yourself or others, or have thoughts about taking your own life, get help right away. You can go to your nearest emergency department or call:  Your local emergency services (911 in the [place]  A suicide crisis helpline, such as the [place] at 985-814-6744. This is open 24 hours a day. Summary  Generalized anxiety disorder (GAD) is a mental health disorder that involves  worry that is not triggered by a specific event.  People with GAD often worry excessively about many things in their lives, such as their health and family.  GAD may cause physical symptoms such as restlessness, trouble concentrating, sleep problems, frequent sweating, nausea, diarrhea, headaches, and trembling or muscle twitching.  A mental health specialist can help determine which treatment is best for you. Some people see improvement with one type of therapy. However, other people require a combination of therapies. This information is not intended to replace advice given to you by your health care provider. Make sure you discuss any questions you have with your health care provider. Document Revised: 09/26/2017 Document Reviewed: 09/03/2016 Elsevier Patient Education  2020 [employer].

## 2020-03-07 NOTE — Progress Notes (Addendum)
Patient Care Team    Relationship Specialty Notifications Start End  Ma Hillock, DO PCP - General Family Medicine  06/01/15   Modesto Charon, PA-C Physician Assistant Obstetrics and Gynecology  03/07/20     SUBJECTIVE Chief Complaint  Patient presents with  . Anxiety    Pt is doing well. Needs refills on medication.     HPI: Patricia Willis is a 19 y.o. female present for  Anxiety:  Patricia Willis reports she is feeling well on mediation. She reports compliance  with 200 mg of Zoloft daily.  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 She has a tripped to the beach planned with her Mother coming up and is looking forward to it.  Feels happy and even states she "feels like she has come a long way" in improving her anxiety condition.   Prior note:  Presents for follow-up on her anxiety today. She states that she has been taking the 150 mg of Zoloft daily. She denies any negative side effects from the medication. She reports increased anxiety currently secondary to a relationship. She has been dating a young man for approximately one year, and she is having problems in that relationship. She has also graduated early this year in December, but will not walk with her class until June. She currently does not have a job and is deciding if she wants to attend college. She states she is not a "school type "of person and really does not want to attend college. She also states she would like to be involved in exercise science/physiology. ROS: See pertinent positives and negatives per HPI.   Of note, she has established with gyn 02/21/2020. She has had her PAP completed and they are managing her BCP as well.    Patient Active Problem List   Diagnosis Date Noted  . Marijuana use 02/04/2018  . Atopic dermatitis 07/24/2015  . Counseling for birth control, oral contraceptives 06/12/2015  . Chronic headaches 06/01/2015  . Generalized anxiety disorder 06/01/2015    Social History    Tobacco Use  . Smoking status: Never Smoker  . Smokeless tobacco: Never Used  Substance Use Topics  . Alcohol use: No    Current Outpatient Medications:  .  hydrocortisone 2.5 % cream, APPLY TOPICALLY 2 (TWO) TIMES DAILY AS NEEDED., Disp: 28.35 g, Rfl: 1 .  magnesium oxide (MAG-OX) 400 MG tablet, Take 400 mg by mouth., Disp: , Rfl:  .  norgestrel-ethinyl estradiol (LO/OVRAL) 0.3-30 MG-MCG tablet, TAKE 1 TABLET BY MOUTH EVERY DAY, Disp: , Rfl:  .  sertraline (ZOLOFT) 100 MG tablet, Take 2 tablets (200 mg total) by mouth daily. Need office visit prior to anymore refills., Disp: 180 tablet, Rfl: 1  Allergies  Allergen Reactions  . Keflex [Cephalexin]     OBJECTIVE: BP 130/83 (BP Location: Right Arm, Patient Position: Sitting, Cuff Size: Normal)   Pulse 60   Temp 98.3 F (36.8 C) (Temporal)   Resp 16   Ht 5\' 5"  (1.651 m)   Wt 120 lb 2 oz (54.5 kg)   LMP 02/02/2020 (Exact Date)   SpO2 97%   BMI 19.99 kg/m  Gen: Afebrile. No acute distress.  HENT: AT. [place].  Eyes:Pupils Equal Round Reactive to light, Extraocular movements intact,  Conjunctiva without redness, discharge or icterus. CV: RRR  Chest: CTAB, no wheeze or crackles Neuro: Normal gait. PERLA. EOMi. Alert. Orientedx3 Psych: Normal affect, dress and demeanor. Normal speech. Normal thought content and judgment.  Depression screen  [facility] 2/9 08/25/2019 03/04/2019 10/09/2018 09/15/2017 02/11/2017  Decreased Interest 1 0 1 1 1   Down, Depressed, Hopeless 0 0 0 3 3  PHQ - 2 Score 1 0 1 4 4   Altered sleeping - - 0 0 3  Tired, decreased energy - - 2 1 3   Change in appetite - - 2 1 1   Feeling bad or failure about yourself  - - 0 2 3  Trouble concentrating - - 0 0 0  Moving slowly or fidgety/restless - - 1 0 0  Suicidal thoughts - - 0 0 0  PHQ-9 Score - - 6 8 14   Difficult doing work/chores - - Not difficult at all - Somewhat difficult   GAD 7 : Generalized Anxiety Score 03/07/2020 08/25/2019 03/04/2019 10/09/2018  Nervous,  Anxious, on Edge 1 0 0 1  Control/stop worrying 0 0 0 0  Worry too much - different things 0 1 1 1   Trouble relaxing 0 1 0 1  Restless 0 0 1 1  Easily annoyed or irritable 2 2 1 2   Afraid - awful might happen 0 0 0 0  Total GAD 7 Score 3 4 3 6   Anxiety Difficulty Somewhat difficult - Not difficult at all Not difficult at all    ASSESSMENT AND PLAN: Patricia Willis is a 19 y.o. female present for  Generalized anxiety disorder -stable. She is doing really great.  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 Practicing Coronavirus precautions.  - Follow-up on 6 months on anxiety as long as doing well, sooner if needed.  No orders of the defined types were placed in this encounter.  Meds ordered this encounter  Medications  . sertraline (ZOLOFT) 100 MG tablet    Sig: Take 2 tablets (200 mg total) by mouth daily. Need office visit prior to anymore refills.    Dispense:  180 tablet    Refill:  1   Referral Orders  No referral(s) requested today      08/27/2019, DO 03/07/2020

## 2020-09-02 DIAGNOSIS — Z20822 Contact with and (suspected) exposure to covid-19: Secondary | ICD-10-CM | POA: Diagnosis not present

## 2020-09-06 ENCOUNTER — Other Ambulatory Visit: Payer: Self-pay

## 2020-09-06 ENCOUNTER — Encounter: Payer: Self-pay | Admitting: Family Medicine

## 2020-09-06 ENCOUNTER — Ambulatory Visit: Payer: BC Managed Care – PPO | Admitting: Family Medicine

## 2020-09-06 VITALS — BP 135/93 | HR 98 | Temp 99.1°F | Ht 65.0 in | Wt 121.0 lb

## 2020-09-06 DIAGNOSIS — F411 Generalized anxiety disorder: Secondary | ICD-10-CM

## 2020-09-06 MED ORDER — SERTRALINE HCL 100 MG PO TABS: 200.0000 mg | ORAL_TABLET | Freq: Every day | ORAL | 1 refills | Status: DC

## 2020-09-06 NOTE — Progress Notes (Signed)
Patient Care Team    Relationship Specialty Notifications Start End  Natalia Leatherwood, DO PCP - General Family Medicine  06/01/15   Ardyth Gal, PA-C Physician Assistant Obstetrics and Gynecology  03/07/20     SUBJECTIVE Chief Complaint  Patient presents with  . Follow-up    [facility]    HPI: Enya Bureau is a 19 y.o. female present for  Anxiety:  Amy reports she is feeling well on zoloft 200 mg qd. 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 She was able to by herself a new car ([employer] 2021). Feels happy and even states she "feels like she has come a long way" in improving her anxiety condition.   Prior note:  Presents for follow-up on her anxiety today. She states that she has been taking the 150 mg of Zoloft daily. She denies any negative side effects from the medication. She reports increased anxiety currently secondary to a relationship. She has been dating a young man for approximately one year, and she is having problems in that relationship. She has also graduated early this year in December, but will not walk with her class until June. She currently does not have a job and is deciding if she wants to attend college. She states she is not a "school type "of person and really does not want to attend college. She also states she would like to be involved in exercise science/physiology. ROS: See pertinent positives and negatives per HPI.   Of note, she has established with gyn 02/21/2020. She has had her PAP completed and they are managing her BCP as well.    Patient Active Problem List   Diagnosis Date Noted  . Marijuana use 02/04/2018  . Atopic dermatitis 07/24/2015  . Counseling for birth control, oral contraceptives 06/12/2015  . Chronic headaches 06/01/2015  . Generalized anxiety disorder 06/01/2015    Social History   Tobacco Use  . Smoking status: Never Smoker  . Smokeless tobacco: Never Used  Substance Use Topics  . Alcohol use:  No    Current Outpatient Medications:  .  hydrocortisone 2.5 % cream, APPLY TOPICALLY 2 (TWO) TIMES DAILY AS NEEDED., Disp: 28.35 g, Rfl: 1 .  magnesium oxide (MAG-OX) 400 MG tablet, Take 400 mg by mouth., Disp: , Rfl:  .  norgestrel-ethinyl estradiol (LO/OVRAL) 0.3-30 MG-MCG tablet, TAKE 1 TABLET BY MOUTH EVERY DAY, Disp: , Rfl:  .  sertraline (ZOLOFT) 100 MG tablet, Take 2 tablets (200 mg total) by mouth daily. Need office visit prior to anymore refills., Disp: 180 tablet, Rfl: 1  Allergies  Allergen Reactions  . Keflex [Cephalexin]     OBJECTIVE: BP (!) 135/93   Pulse 98   Temp 99.1 F (37.3 C) (Oral)   Ht 5\' 5"  (1.651 m)   Wt 121 lb (54.9 kg)   SpO2 97%   BMI 20.14 kg/m  Gen: Afebrile. No acute distress.  HENT: AT. [place].  Eyes:Pupils Equal Round Reactive to light, Extraocular movements intact,  Conjunctiva without redness, discharge or icterus. CV: RRR  Neuro: Normal gait. PERLA. EOMi. Alert. Oriented x3  Psych: Normal affect, dress and demeanor. Normal speech. Normal thought content and judgment.  ASSESSMENT AND PLAN: Veverly Larimer is a 19 y.o. female present for  Generalized anxiety disorder -stable.  - continue   Zoloft 200 mg daily - working at a doggy-day -care.  - Follow-up on 5.5 months on anxiety as long as doing well, sooner if needed.  No orders of the defined types were placed in this encounter.  Meds ordered this encounter  Medications  . sertraline (ZOLOFT) 100 MG tablet    Sig: Take 2 tablets (200 mg total) by mouth daily. Need office visit prior to anymore refills.    Dispense:  180 tablet    Refill:  1   Referral Orders  No referral(s) requested today      Felix Pacini, DO 09/06/2020

## 2020-09-06 NOTE — Patient Instructions (Signed)
I have refilled you zoloft for you.  Follow up in 5.5 mos- end of April.

## 2020-12-22 ENCOUNTER — Other Ambulatory Visit: Payer: Self-pay

## 2020-12-25 ENCOUNTER — Other Ambulatory Visit: Payer: Self-pay

## 2020-12-25 ENCOUNTER — Encounter: Payer: Self-pay | Admitting: Family Medicine

## 2020-12-25 ENCOUNTER — Ambulatory Visit: Payer: BC Managed Care – PPO | Admitting: Family Medicine

## 2020-12-25 VITALS — BP 135/91 | HR 76 | Temp 99.0°F | Ht 65.0 in | Wt 131.0 lb

## 2020-12-25 DIAGNOSIS — M533 Sacrococcygeal disorders, not elsewhere classified: Secondary | ICD-10-CM

## 2020-12-25 DIAGNOSIS — M545 Low back pain, unspecified: Secondary | ICD-10-CM | POA: Diagnosis not present

## 2020-12-25 MED ORDER — CYCLOBENZAPRINE HCL 5 MG PO TABS: 5.0000 mg | ORAL_TABLET | Freq: Two times a day (BID) | ORAL | 1 refills | Status: DC | PRN

## 2020-12-25 MED ORDER — METHYLPREDNISOLONE ACETATE 80 MG/ML IJ SUSP
80.0000 mg | Freq: Once | INTRAMUSCULAR | Status: AC
  Administered 2020-12-25: 80 mg via INTRAMUSCULAR

## 2020-12-25 MED ORDER — DICLOFENAC SODIUM ER 100 MG PO TB24: 100.0000 mg | ORAL_TABLET | Freq: Every day | ORAL | 2 refills | Status: DC
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
## 2020-12-25 NOTE — Patient Instructions (Addendum)
Start diclofenac once daily Use heating pad for comfort.  Cyclobenzaprine is a muscle relaxer- take before bed and may take one other time in day if not driving.   We will call you with xray results and further plan.    Sacroiliac Joint Dysfunction  Sacroiliac joint dysfunction is a condition that causes inflammation on one or both sides of the sacroiliac (SI) joint. The SI joint is the joint between two bones of the pelvis called the sacrum and the ilium. The sacrum is the bone at the base of the spine. The ilium is the large bone that forms the hip. This condition causes deep aching or burning pain in the low back. In some cases, the pain may also spread into one or both buttocks, hips, or thighs. What are the causes? This condition may be caused by:  Pregnancy. During pregnancy, extra stress is put on the SI joints because the pelvis widens.  Injury, such as: ? Injuries from car crashes. ? Sports-related injuries. ? Work-related injuries.  Having one leg that is shorter than the other.  Conditions that affect the joints, such as: ? Rheumatoid arthritis. ? Gout. ? Psoriatic arthritis. ? Joint infection (septic arthritis). Sometimes, the cause of SI joint dysfunction is not known. What are the signs or symptoms? Symptoms of this condition include:  Aching or burning pain in the lower back. The pain may also spread to other areas, such as: ? Buttocks. ? Groin. ? Thighs.  Muscle spasms in or around the painful areas.  Increased pain when standing, walking, running, stair climbing, bending, or lifting. How is this diagnosed? This condition is diagnosed with a physical exam and your medical history. During the exam, the health care provider may move one or both of your legs to different positions to check for pain. Various tests may be done to confirm the diagnosis, including:  Imaging tests to look for other causes of pain. These may include: ? MRI. ? CT scan. ? Bone  scan.  Diagnostic injection. A numbing medicine is injected into the SI joint using a needle. If your pain is temporarily improved or stopped after the injection, this can indicate that SI joint dysfunction is the problem. How is this treated? Treatment depends on the cause and severity of your condition. Treatment options can be noninvasive and may include:  Ice or heat applied to the lower back area after an injury. This may help reduce pain and muscle spasms.  Medicines to relieve pain or inflammation or to relax the muscles.  Wearing a back brace (sacroiliac brace) to help support the joint while your back is healing.  Physical therapy to increase muscle strength around the joint and flexibility at the joint. This may also involve learning proper body positions and ways of moving to relieve stress on the joint.  Direct manipulation of the SI joint.  Use of a device that provides electrical stimulation to help reduce pain at the joint. Other treatments may include:  Injections of steroid medicine into the joint to reduce pain and swelling.  Radiofrequency ablation. This treatment uses heat to burn away nerves that are carrying pain messages from the joint.  Surgery to put in screws and plates that limit or prevent joint motion. This is rare. Follow these instructions at home: Medicines  Take over-the-counter and prescription medicines only as told by your health care provider.  Ask your health care provider if the medicine prescribed to you: ? Requires you to avoid driving or using  machinery. ? Can cause constipation. You may need to take these actions to prevent or treat constipation:  Drink enough fluid to keep your urine pale yellow.  Take over-the-counter or prescription medicines.  Eat foods that are high in fiber, such as beans, whole grains, and fresh fruits and vegetables.  Limit foods that are high in fat and processed sugars, such as fried or sweet foods. If you  have a brace:  Wear the brace as told by your health care provider. Remove it only as told by your health care provider.  Keep the brace clean.  If the brace is not waterproof: ? Do not let it get wet. ? Cover it with a watertight covering when you take a bath or a shower. Managing pain, stiffness, and swelling  Icing can help with pain and swelling. Heat may help with muscle tension or spasms. Ask your health care provider if you should use ice or heat.  If directed, put ice on the affected area: ? If you have a removable brace, remove it as told by your health care provider. ? Put ice in a plastic bag. ? Place a towel between your skin and the bag. ? Leave the ice on for 20 minutes, 2-3 times a day. ? Remove the ice if your skin turns bright red. This is very important. If you cannot feel pain, heat, or cold, you have a greater risk of damage to the area.  If directed, apply heat to the affected area as often as told by your health care provider. Use the heat source that your health care provider recommends, such as a moist heat pack or a heating pad. ? Place a towel between your skin and the heat source. ? Leave the heat on for 20-30 minutes. ? Remove the heat if your skin turns bright red. This is especially important if you are unable to feel pain, heat, or cold. You may have a greater risk of getting burned.      General instructions  Rest as needed. Return to your normal activities as told by your health care provider. Ask your health care provider what activities are safe for you.  Do exercises as told by your health care provider or physical therapist.  Keep all follow-up visits. This is important. Contact a health care provider if:  Your pain is not controlled with medicine.  You have a fever.  Your pain is getting worse. Get help right away if:  You have weakness, numbness, or tingling in your legs or feet.  You lose control of your bladder or  bowels. Summary  Sacroiliac (SI) joint dysfunction is a condition that causes inflammation on one or both sides of the SI joint.  This condition causes deep aching or burning pain in the low back. In some cases, the pain may also spread into one or both buttocks, hips, or thighs.  Treatment depends on the cause and severity of your condition. It may include medicines to reduce pain and swelling or to relax muscles. This information is not intended to replace advice given to you by your health care provider. Make sure you discuss any questions you have with your health care provider. Document Revised: 02/24/2020 Document Reviewed: 02/24/2020 Elsevier Patient Education  2021 [employer].

## 2020-12-28 ENCOUNTER — Other Ambulatory Visit: Payer: Self-pay

## 2020-12-28 ENCOUNTER — Ambulatory Visit (HOSPITAL_BASED_OUTPATIENT_CLINIC_OR_DEPARTMENT_OTHER)
Admission: RE | Admit: 2020-12-28 | Discharge: 2020-12-28 | Disposition: A | Discharge status: Home / Self Care | Payer: BC Managed Care – PPO | Source: Ambulatory Visit | Attending: Family Medicine | Admitting: Family Medicine

## 2020-12-28 DIAGNOSIS — M533 Sacrococcygeal disorders, not elsewhere classified: Secondary | ICD-10-CM | POA: Insufficient documentation

## 2020-12-28 DIAGNOSIS — M545 Low back pain, unspecified: Secondary | ICD-10-CM | POA: Diagnosis not present

## 2020-12-29 ENCOUNTER — Telehealth: Payer: Self-pay | Admitting: Family Medicine

## 2020-12-29 DIAGNOSIS — M545 Low back pain, unspecified: Secondary | ICD-10-CM

## 2020-12-29 NOTE — Telephone Encounter (Signed)
Please inform patient the following information: Her back xray is normal.  I have placed and order for physical therapy at the [facility] location, Per her request

## 2020-12-29 NOTE — Telephone Encounter (Signed)
Patient advised and voiced understanding.  

## 2021-01-22 ENCOUNTER — Other Ambulatory Visit: Payer: Self-pay

## 2021-01-22 ENCOUNTER — Ambulatory Visit: Payer: BC Managed Care – PPO | Admitting: Family Medicine

## 2021-01-22 ENCOUNTER — Encounter: Payer: Self-pay | Admitting: Family Medicine

## 2021-01-22 VITALS — BP 138/85 | HR 77 | Temp 98.4°F | Ht 65.0 in | Wt 130.0 lb

## 2021-01-22 DIAGNOSIS — S39012D Strain of muscle, fascia and tendon of lower back, subsequent encounter: Secondary | ICD-10-CM

## 2021-01-22 NOTE — Patient Instructions (Signed)
Lumbar Strain A lumbar strain, which is sometimes called a low-back strain, is a stretch or tear in a muscle or the strong cords of tissue that attach muscle to bone (tendons) in the lower back (lumbar spine). This type of injury occurs when muscles or tendons are torn or are stretched beyond their limits. Lumbar strains can range from mild to severe. Mild strains may involve stretching a muscle or tendon without tearing it. These may heal in 1-2 weeks. More severe strains involve tearing of muscle fibers or tendons. These will cause more pain and may take 6-8 weeks to heal. What are the causes? This condition may be caused by:  Trauma, such as a fall or a hit to the body.  Twisting or overstretching the back. This may result from doing activities that need a lot of energy, such as lifting heavy objects. What increases the risk? This injury is more common in:  Athletes.  People with obesity.  People who do repeated lifting, bending, or other movements that involve their back. What are the signs or symptoms? Symptoms of this condition may include:  Sharp or dull pain in the lower back that does not go away. The pain may extend to the buttocks.  Stiffness or limited range of motion.  Sudden muscle tightening (spasms). How is this diagnosed? This condition may be diagnosed based on:  Your symptoms.  Your medical history.  A physical exam.  Imaging tests, such as: ? X-rays. ? MRI. How is this treated? Treatment for this condition may include:  Rest.  Applying heat and cold to the affected area.  Over-the-counter medicines to help relieve pain and inflammation, such as NSAIDs.  Prescription pain medicine and muscle relaxants may be needed for a short time.  Physical therapy. Follow these instructions at home: Managing pain, stiffness, and swelling  If directed, put ice on the injured area during the first 24 hours after your injury. ? Put ice in a plastic bag. ? Place  a towel between your skin and the bag. ? Leave the ice on for 20 minutes, 2-3 times a day.  If directed, apply heat to the affected area as often as told by your health care provider. Use the heat source that your health care provider recommends, such as a moist heat pack or a heating pad. ? Place a towel between your skin and the heat source. ? Leave the heat on for 20-30 minutes. ? Remove the heat if your skin turns bright red. This is especially important if you are unable to feel pain, heat, or cold. You may have a greater risk of getting burned.      Activity  Rest and return to your normal activities as told by your health care provider. Ask your health care provider what activities are safe for you.  Do exercises as told by your health care provider. Medicines  Take over-the-counter and prescription medicines only as told by your health care provider.  Ask your health care provider if the medicine prescribed to you: ? Requires you to avoid driving or using heavy machinery. ? Can cause constipation. You may need to take these actions to prevent or treat constipation:  Drink enough fluid to keep your urine pale yellow.  Take over-the-counter or prescription medicines.  Eat foods that are high in fiber, such as beans, whole grains, and fresh fruits and vegetables.  Limit foods that are high in fat and processed sugars, such as fried or sweet foods. Injury prevention To   prevent a future low-back injury:  Always warm up properly before physical activity or sports.  Cool down and stretch after being active.  Use correct form when playing sports and lifting heavy objects. Bend your knees before you lift heavy objects.  Use good posture when sitting and standing.  Stay physically fit and keep a healthy weight. ? Do at least 150 minutes of moderate-intensity exercise each week, such as brisk walking or water aerobics. ? Do strength exercises at least 2 times each week.    General instructions  Do not use any products that contain nicotine or tobacco, such as cigarettes, e-cigarettes, and chewing tobacco. If you need help quitting, ask your health care provider.  Keep all follow-up visits as told by your health care provider. This is important. Contact a health care provider if:  Your back pain does not improve after 6 weeks of treatment.  Your symptoms get worse. Get help right away if:  Your back pain is severe.  You are unable to stand or walk.  You develop pain in your legs.  You develop weakness in your buttocks or legs.  You have difficulty controlling when you urinate or when you have a bowel movement. ? You have frequent, painful, or bloody urination. ? You have a temperature over 101.0F (38.3C) Summary  A lumbar strain, which is sometimes called a low-back strain, is a stretch or tear in a muscle or the strong cords of tissue that attach muscle to bone (tendons) in the lower back (lumbar spine).  This type of injury occurs when muscles or tendons are torn or are stretched beyond their limits.  Rest and return to your normal activities as told by your health care provider. If directed, apply heat and ice to the affected area as often as told by your health care provider.  Take over-the-counter and prescription medicines only as told by your health care provider.  Contact a health care provider if you have new or worsening symptoms. This information is not intended to replace advice given to you by your health care provider. Make sure you discuss any questions you have with your health care provider. Document Revised: 08/13/2018 Document Reviewed: 08/13/2018 Elsevier Patient Education  2021 [employer]  
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
## 2021-01-23 ENCOUNTER — Ambulatory Visit: Payer: BC Managed Care – PPO | Attending: Family Medicine | Admitting: Physical Therapy

## 2021-01-23 ENCOUNTER — Encounter: Payer: Self-pay | Admitting: Physical Therapy

## 2021-01-23 DIAGNOSIS — M533 Sacrococcygeal disorders, not elsewhere classified: Secondary | ICD-10-CM | POA: Insufficient documentation

## 2021-01-23 DIAGNOSIS — R293 Abnormal posture: Secondary | ICD-10-CM | POA: Insufficient documentation

## 2021-01-23 DIAGNOSIS — M545 Low back pain, unspecified: Secondary | ICD-10-CM | POA: Diagnosis not present

## 2021-01-23 NOTE — Therapy (Addendum)
[facility] [address]  [place], [place], [address] Phone: [phone]   Fax:  [phone]  Physical Therapy Evaluation  Patient Details  Name: Patricia Willis MRN: [id number] Date of Birth: [birth year] Referring Provider (PT): Howard Pouch, DO   Encounter Date: 01/23/2021   PT End of Session - 01/23/21 1538    Visit Number 1    Number of Visits 17    Date for PT Re-Evaluation 03/20/21    Authorization Type [employer]    Authorization - Visit Number 1    Authorization - Number of Visits 20    PT Start Time 3267    PT Stop Time 1444    PT Time Calculation (min) 39 min    Activity Tolerance Patient tolerated treatment well    Behavior During Therapy [facility] for tasks assessed/performed           Past Medical History:  Diagnosis Date  . Allergic rhinitis   . Anxiety with somatization   . Atopic dermatitis   . Constipation   . Depression   . Headache   . Sleep disturbances     Past Surgical History:  Procedure Laterality Date  . TONSILLECTOMY      There were no vitals filed for this visit.    Subjective Assessment - 01/23/21 1407    Subjective Patient reports LBP started in mid January without acute injury. Pain was located in the L LB and top of the buttock. Noting N/T in the same distribution with intermittent radiation down the L LE to above the knee. Worse with "everything" including walking, standing, bending over. Better when laying flat on her back. Denies changes in B&B control. Reports that the pain started feeling better about 2 weeks ago- notes that she was given an anti-inflammatory and a muscle relaxer. Reports that every so often she still gets pain, especially when bending over such as at work. However otherwise feeling much better.    Pertinent History HA, depression, anxiety    Limitations Sitting;Lifting;Standing;House hold activities;Walking    How long can you sit comfortably? unlimited     How long can you stand comfortably? unlimited    How long can you walk comfortably? unlimited    Diagnostic tests 12/28/20 lumbar xray: negative    Patient Stated Goals decrease pain    Currently in Pain? Yes    Pain Score 0-No pain    Pain Location Back    Pain Orientation Left;Lower    Pain Descriptors / Indicators Throbbing;Aching;Sharp;Tightness    Pain Type Acute pain              [facility] PT Assessment - 01/23/21 1413      Assessment   Medical Diagnosis Lumbar pain    Referring Provider (PT) Howard Pouch, DO    Onset Date/Surgical Date 11/11/20    Next MD Visit not scheduled    Prior Therapy not for back      Precautions   Precautions None      Balance Screen   Has the patient fallen in the past 6 months No    Has the patient had a decrease in activity level because of a fear of falling?  No    Is the patient reluctant to leave their home because of a fear of falling?  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, walking, lifting, bathing dogs    Leisure none      Cognition   Overall Cognitive Status Within Functional Limits for tasks assessed      Sensation   Light Touch Appears Intact      Coordination   Gross Motor Movements are Fluid and Coordinated Yes      Posture/Postural Control   Posture/Postural Control Postural limitations    Postural Limitations Rounded Shoulders    Posture Comments weight shifted over toes in standing      ROM / Strength   AROM / PROM / Strength AROM;Strength      AROM   AROM Assessment Site Lumbar    Lumbar Flexion  toes    Lumbar Extension mildly limited   mild pain   Lumbar - Right Side Bend proximal tibia    Lumbar - Left Side Bend proximal tibia    Lumbar - Right Rotation WFL   pain in QL   Lumbar - Left Rotation WFL   pain in QL     Strength   Strength Assessment Site Hip;Knee;Ankle    Right/Left Hip Right;Left    Right Hip Flexion 4+/5    Right Hip ABduction 4+/5    Right Hip ADduction 4+/5    Left Hip Flexion 4+/5    Left Hip ABduction 4+/5    Left Hip ADduction 4+/5    Right/Left Knee Right;Left    Right Knee Flexion 4/5    Right Knee Extension 4+/5    Left Knee Flexion 4/5    Left Knee Extension 4/5    Right/Left Ankle Right;Left    Right Ankle Dorsiflexion 4+/5    Right Ankle Plantar Flexion 5/5    Left Ankle Dorsiflexion 4+/5    Left Ankle Plantar Flexion 4+/5   20 reps with compensations     Flexibility   Soft Tissue Assessment /Muscle Length yes    Hamstrings B WNL    Quadriceps L mildly tight in mod thomas; R WNL    Piriformis B WNL      Palpation   SI assessment  L ASIS higher, L PSIS lower, suggesting posteriorly rotated L innominate    Palpation comment soft tissue restriction in L thoracolumbar paraspinals with TTP in L lumbar paraspinals; R scapula slightly protracted      Ambulation/Gait   Gait Pattern Within Functional Limits    Ambulation Surface Level;Indoor    Gait velocity WNL                      Objective measurements completed on examination: See above findings.               PT Education - 01/23/21 1538    Education Details prognosis, POC, HEP    Person(s) Educated Patient    Methods Explanation;Demonstration;Tactile cues;Verbal cues;Handout    Comprehension Verbalized understanding;Returned demonstration            PT Short Term Goals - 01/23/21 1545      PT SHORT TERM GOAL #1   Title Patient to be independent with initial HEP.  Time 3    Period Weeks    Status New    Target Date 02/13/21             PT  Long Term Goals - 01/23/21 1545      PT LONG TERM GOAL #1   Title Patient to be independent with advanced HEP.    Time 8    Period Weeks    Status New    Target Date 03/20/21      PT LONG TERM GOAL #2   Title Patient to demonstrate B LE strength >/=4+/5.    Time 8    Period Weeks    Status New    Target Date 03/20/21      PT LONG TERM GOAL #3   Title Patient to demonstrate lumbar AROM WFL and without pain limiting.    Time 8    Period Weeks    Status New    Target Date 03/20/21      PT LONG TERM GOAL #4   Title Patient to demonstrate lifting 20lb box from the floor with good body mechanics and no pain.    Time 8    Period Weeks    Target Date 03/20/21      PT LONG TERM GOAL #5   Title Patient to report return to pain-free job duties.    Time 8    Period Weeks    Status New    Target Date 03/20/21                  Plan - 01/23/21 1539    Clinical Impression Statement Patient is a 20 y/o F presenting to OPPT with c/o acute insidious L LB and buttock pain since January 2022. Pain is located over the L LB, radiating into the buttock. Notes N/T in the same distribution with intermittent radiation down the LE stopping at the knee. Denies changes in B&B control. Notes improvement in pain since initial onset with pain meds. Pain does still return with prolonged bending which she is required to do at work. Patient today presenting with slightly limited and painful lumbar AROM, decreased quad and HS strength, mildly tight L hip flexor/quads, posteriorly rotated L innominate, and soft tissue restriction in L thoracolumbar paraspinals with TTP in L lumbar paraspinals. Patient was educated on gentle core strengthening and lumbar mobility HEP- patient reported understanding. Would benefit from skilled PT services 1-2x/week for 8 weeks as needed to address aforementioned impairments.    Personal Factors and Comorbidities Age;Comorbidity 3+;Fitness;Past/Current  Experience;Profession;Time since onset of injury/illness/exacerbation    Comorbidities HA, depression, anxiety    Examination-Activity Limitations Bend;Squat;Stairs;Carry;Lift;Reach Overhead    Examination-Participation Restrictions Church;Cleaning;Community Activity;Yard Work;Laundry;Occupation    Stability/Clinical Decision Making Evolving/Moderate complexity    Clinical Decision Making Moderate    Rehab Potential Good    PT Frequency Other (comment)   1-2x/week for 8 weeks as needed   PT Treatment/Interventions ADLs/Self Care Home Management;Cryotherapy;Electrical Stimulation;Moist Heat;Balance training;Therapeutic exercise;Therapeutic activities;Functional mobility training;Stair training;Gait training;Ultrasound;Neuromuscular re-education;Patient/family education;Manual techniques;Taping;Energy conservation;Dry needling;Passive range of motion    PT Next Visit Plan reassess HEP; reassess SI alignment, core and hip strengthening    Consulted and Agree with Plan of Care Patient           Patient will benefit from skilled therapeutic intervention in order to improve the following deficits and impairments:  Decreased activity tolerance,Decreased strength,Pain,Increased fascial restricitons,Increased muscle spasms,Improper body mechanics,Decreased range of motion,Impaired flexibility,Postural dysfunction  Visit Diagnosis: Sacrococcygeal disorders, not elsewhere classified  Acute left-sided low back pain without sciatica  Abnormal posture     Problem List Patient Active Problem List   Diagnosis Date Noted  . Marijuana use 02/04/2018  . Atopic dermatitis 07/24/2015  . Counseling for birth control, oral contraceptives 06/12/2015  . Chronic headaches 06/01/2015  . Generalized anxiety disorder 06/01/2015     Janene Harvey, PT, DPT 01/23/21 3:49 PM   [facility] [address]  [place], [place],  [address] Phone: [phone]   Fax:  [phone]  Name: Patricia Willis MRN: [id number] Date of Birth: [birth year]    PHYSICAL THERAPY DISCHARGE SUMMARY  Visits from Start of Care: 1  Current functional level related to goals / functional outcomes: See above clinical impression; patient did not return d/t resolution of pain   Remaining deficits: See above   Education / Equipment: HEP  Plan: Patient agrees to discharge.  Patient goals were not met. Patient is being discharged due to the patient's request.  ?????     Janene Harvey, PT, DPT 02/14/21 11:53 AM

## 2021-01-26 ENCOUNTER — Encounter: Payer: Self-pay | Admitting: Family Medicine

## 2021-02-06 ENCOUNTER — Ambulatory Visit: Payer: BC Managed Care – PPO | Admitting: Physical Therapy

## 2021-02-27 ENCOUNTER — Encounter: Payer: Self-pay | Admitting: Family Medicine

## 2021-03-01 ENCOUNTER — Other Ambulatory Visit: Payer: Self-pay | Admitting: Family Medicine

## 2021-03-01 DIAGNOSIS — F411 Generalized anxiety disorder: Secondary | ICD-10-CM

## 2021-03-05 ENCOUNTER — Ambulatory Visit: Payer: BC Managed Care – PPO | Admitting: Family Medicine

## 2021-03-05 ENCOUNTER — Other Ambulatory Visit: Payer: Self-pay

## 2021-03-05 ENCOUNTER — Encounter: Payer: Self-pay | Admitting: Family Medicine

## 2021-03-05 DIAGNOSIS — F411 Generalized anxiety disorder: Secondary | ICD-10-CM

## 2021-03-05 MED ORDER — SERTRALINE HCL 100 MG PO TABS
200.0000 mg | ORAL_TABLET | Freq: Every day | ORAL | 1 refills | Status: DC
Start: 2021-03-05 — End: 2022-01-28

## 2021-03-05 NOTE — Progress Notes (Signed)
Patient Care Team    Relationship Specialty Notifications Start End  Natalia Leatherwood, DO PCP - General Family Medicine  06/01/15   Ardyth Gal, PA-C Physician Assistant Obstetrics and Gynecology  03/07/20     SUBJECTIVE Chief Complaint  Patient presents with  . Medication Refill    HPI: Patricia Willis is a 20 y.o. female present for  Anxiety:  Ashonte reports she is feeling well on zoloft 200 mg qd. 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 She was able to by herself a new car ([employer] 2021). Feels happy and even states she "feels like she has come a long way" in improving her anxiety condition.  Prior note:  Presents for follow-up on her anxiety today. She states that she has been taking the 150 mg of Zoloft daily. She denies any negative side effects from the medication. She reports increased anxiety currently secondary to a relationship. She has been dating a young man for approximately one year, and she is having problems in that relationship. She has also graduated early this year in December, but will not walk with her class until June. She currently does not have a job and is deciding if she wants to attend college. She states she is not a "school type "of person and really does not want to attend college. She also states she would like to be involved in exercise science/physiology. ROS: See pertinent positives and negatives per HPI.   Of note, she has established with gyn 02/21/2020. She has had her PAP completed and they are managing her BCP as well.    Patient Active Problem List   Diagnosis Date Noted  . Marijuana use 02/04/2018  . Atopic dermatitis 07/24/2015  . Counseling for birth control, oral contraceptives 06/12/2015  . Chronic headaches 06/01/2015  . Generalized anxiety disorder 06/01/2015    Social History   Tobacco Use  . Smoking status: Never Smoker  . Smokeless tobacco: Never Used  Substance Use Topics  . Alcohol use: No     Current Outpatient Medications:  .  hydrocortisone 2.5 % cream, APPLY TOPICALLY 2 (TWO) TIMES DAILY AS NEEDED., Disp: 28.35 g, Rfl: 1 .  magnesium oxide (MAG-OX) 400 MG tablet, Take 400 mg by mouth., Disp: , Rfl:  .  norgestrel-ethinyl estradiol (LO/OVRAL) 0.3-30 MG-MCG tablet, TAKE 1 TABLET BY MOUTH EVERY DAY, Disp: , Rfl:  .  sertraline (ZOLOFT) 100 MG tablet, Take 2 tablets (200 mg total) by mouth daily., Disp: 180 tablet, Rfl: 1  Allergies  Allergen Reactions  . Keflex [Cephalexin]     OBJECTIVE: BP 121/84 (BP Location: Left Arm, Patient Position: Sitting, Cuff Size: Large)   Pulse 93   Temp 98.8 F (37.1 C) (Temporal)   Ht 5\' 5"  (1.651 m)   Wt 128 lb (58.1 kg)   SpO2 98%   BMI 21.30 kg/m  Gen: Afebrile. No acute distress. Nontoxic, pleasant female.  HENT: AT. [place].  Eyes:Pupils Equal Round Reactive to light, Extraocular movements intact,  Conjunctiva without redness, discharge or icterus. CV: RRR * Chest: CTAB, no wheeze or crackles Neuro: Normal gait. PERLA. EOMi. Alert. Oriented x3 Psych: Normal affect, dress and demeanor. Normal speech. Normal thought content and judgment..   ASSESSMENT AND PLAN: Yvone Slape is a 20 y.o. female present for  Generalized anxiety disorder -stable. Feeling good.  - continue   Zoloft 100- 200 mg daily, she will see if she can taper to 100 mg. She understands she can  go back 200 mg if needed.  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  - Follow-up on 5.5 months on anxiety as long as doing well, sooner if needed.   No orders of the defined types were placed in this encounter.  Meds ordered this encounter  Medications  . sertraline (ZOLOFT) 100 MG tablet    Sig: Take 2 tablets (200 mg total) by mouth daily.    Dispense:  180 tablet    Refill:  1   Referral Orders  No referral(s) requested today   > 20 Minutes was dedicated to this patient's encounter to include pre-visit review of chart, face-to-face time with patient and post-visit  work- which include documentation and prescribing medications and/or ordering test when necessary.      Felix Pacini, DO 03/05/2021

## 2021-03-05 NOTE — Patient Instructions (Signed)
Great to see you today.  I have refilled the medication(s) we provide.   If labs were collected, we will inform you of lab results once received either by echart message or telephone call.   - echart message- for normal results that have been seen by the patient already.   - telephone call: abnormal results or if patient has not viewed results in their echart.  

## 2021-09-06 ENCOUNTER — Other Ambulatory Visit: Payer: Self-pay | Admitting: Family Medicine

## 2021-09-06 DIAGNOSIS — F411 Generalized anxiety disorder: Secondary | ICD-10-CM

## 2021-09-27 DIAGNOSIS — Z01419 Encounter for gynecological examination (general) (routine) without abnormal findings: Secondary | ICD-10-CM | POA: Diagnosis not present

## 2021-11-13 IMAGING — CR DG LUMBAR SPINE COMPLETE 4+V
5 series · 5 of 5 positions shown · non-contrast
Comparison: None.

CLINICAL DATA: Low back pain, left SI joint pain

EXAM:
LUMBAR SPINE - COMPLETE 4+ VIEW

[t l-spine a.p.]
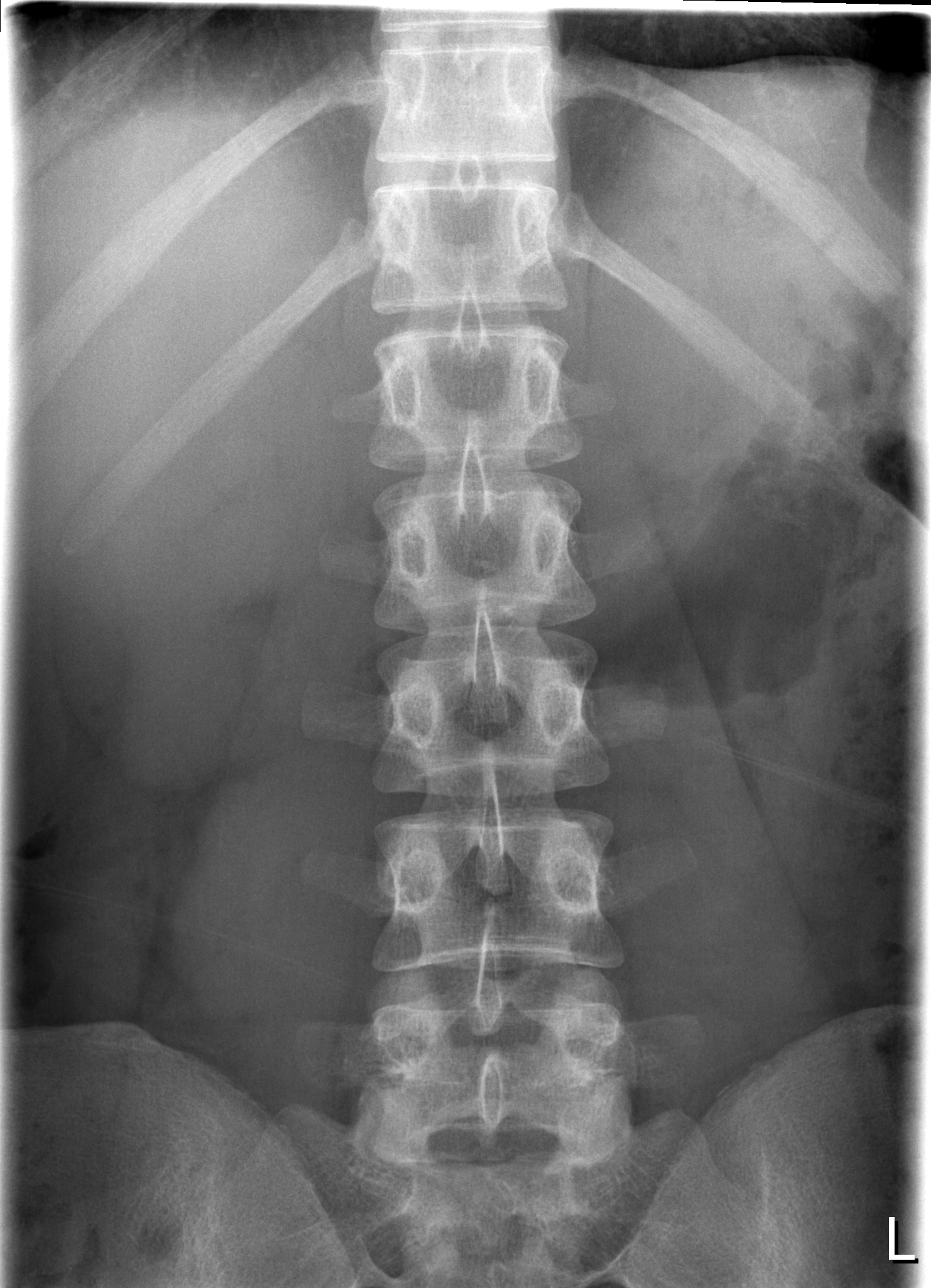

[t l-spine oblique exposure (1 of 2)]
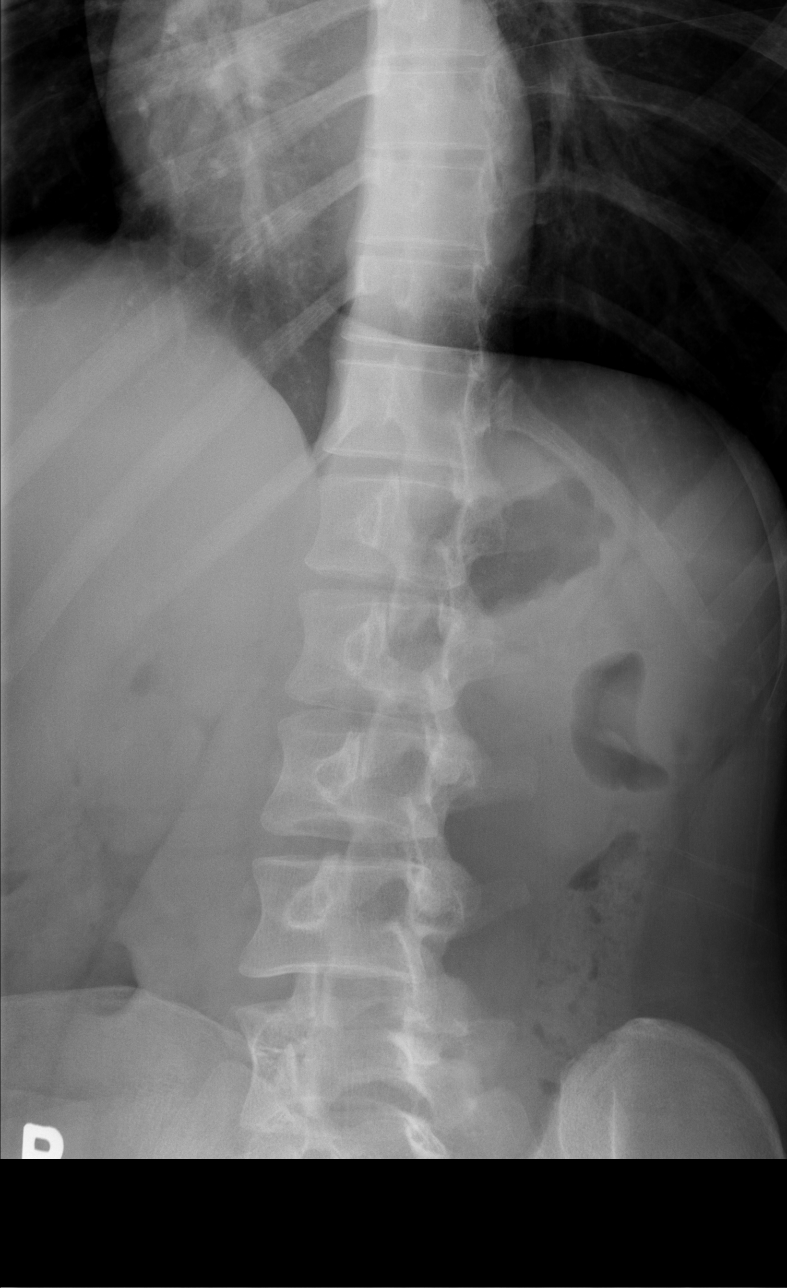

[t l-spine oblique exposure (2 of 2)]
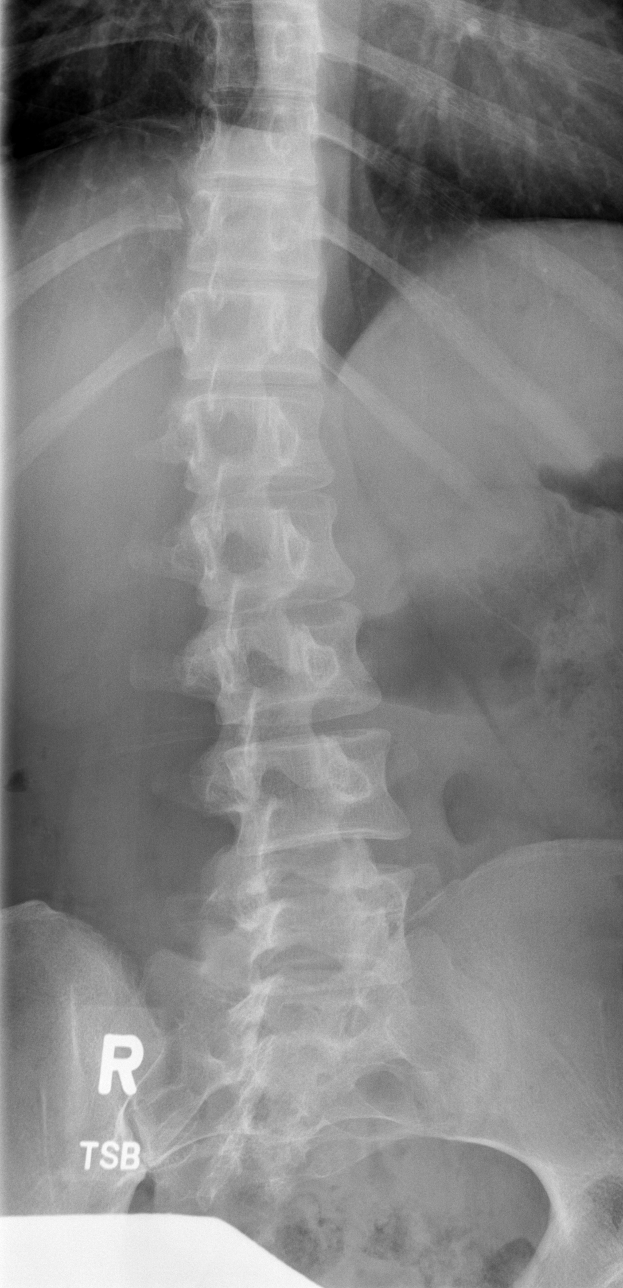

[t l-spine lat *]
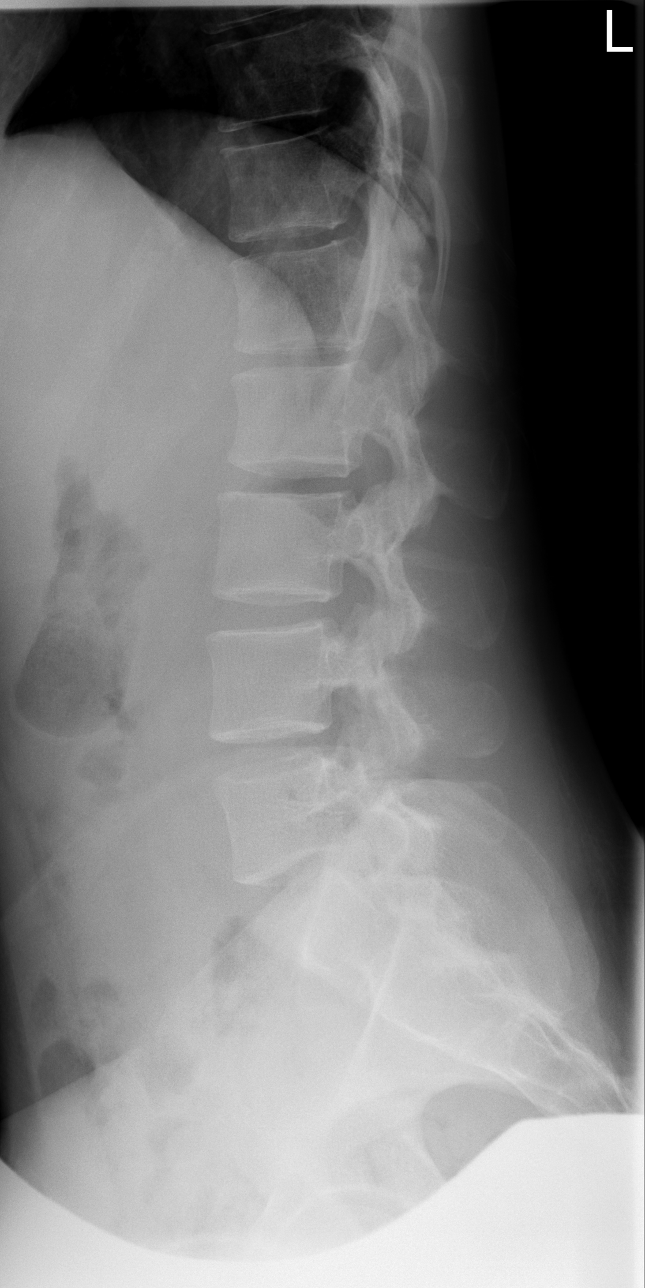

[t l-spine l5-s1 spot]
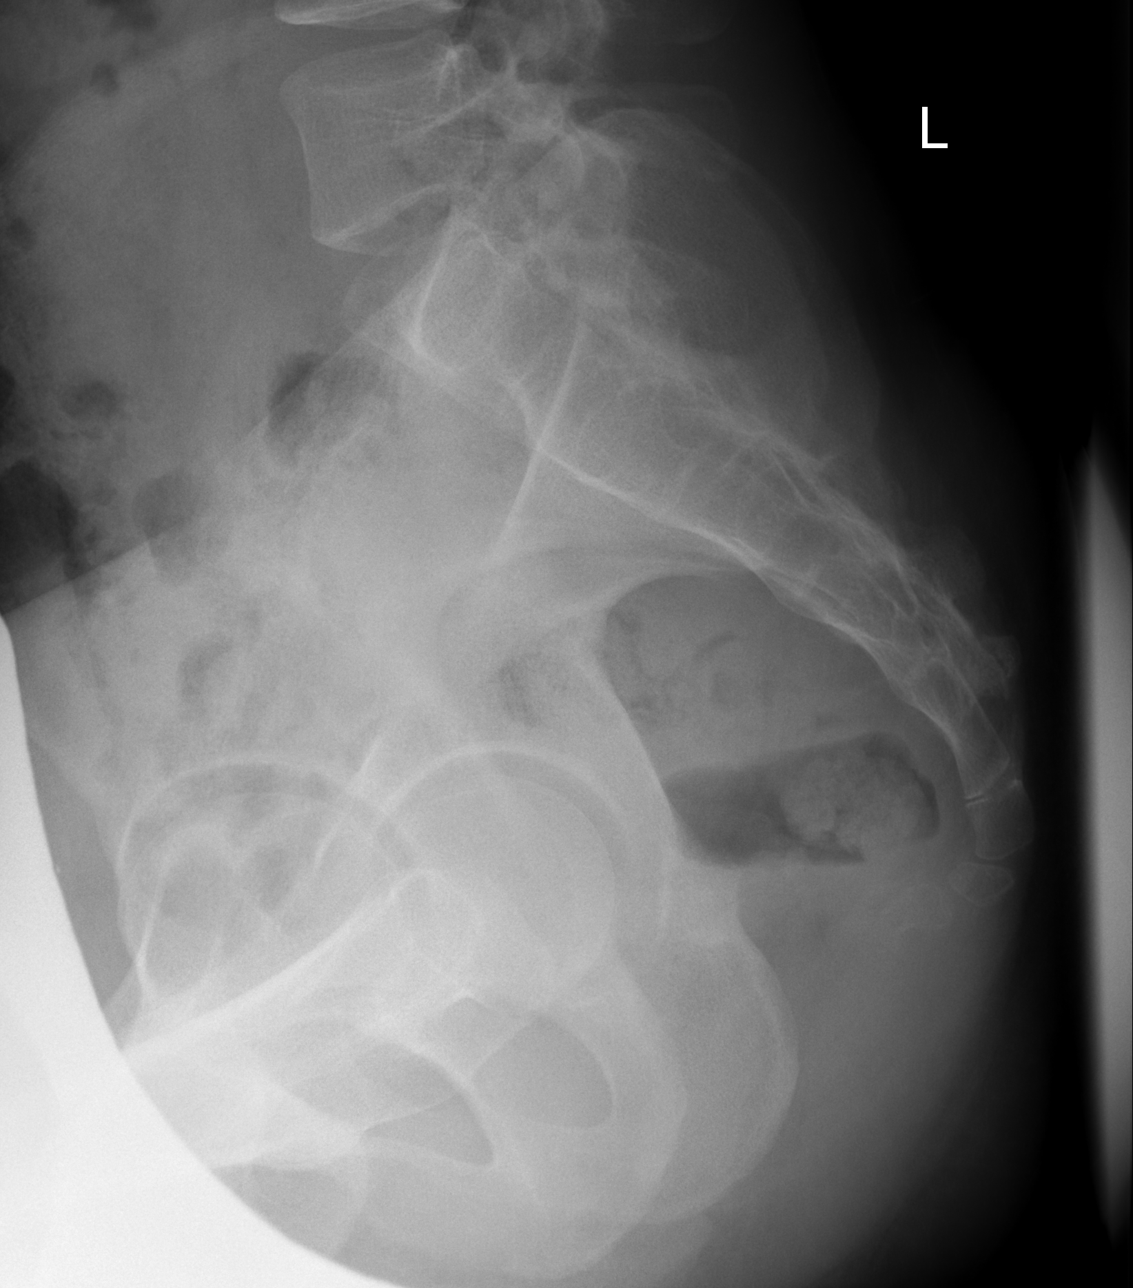

[5 of 5 positions shown; findings below may reference images not displayed]

FINDINGS: There is no evidence of lumbar spine fracture. Alignment is normal.
Intervertebral disc spaces are maintained.
IMPRESSION: Negative.

## 2022-01-24 DIAGNOSIS — R1084 Generalized abdominal pain: Secondary | ICD-10-CM | POA: Diagnosis not present

## 2022-01-24 DIAGNOSIS — R112 Nausea with vomiting, unspecified: Secondary | ICD-10-CM | POA: Diagnosis not present

## 2022-01-24 DIAGNOSIS — M791 Myalgia, unspecified site: Secondary | ICD-10-CM | POA: Diagnosis not present

## 2022-01-24 DIAGNOSIS — B9789 Other viral agents as the cause of diseases classified elsewhere: Secondary | ICD-10-CM | POA: Diagnosis not present

## 2022-01-27 ENCOUNTER — Encounter: Payer: Self-pay | Admitting: Family Medicine

## 2022-01-28 ENCOUNTER — Ambulatory Visit: Payer: BC Managed Care – PPO | Admitting: Family Medicine

## 2022-01-28 VITALS — BP 127/83 | HR 106 | Temp 99.4°F | Ht 65.0 in | Wt 141.0 lb

## 2022-01-28 DIAGNOSIS — R112 Nausea with vomiting, unspecified: Secondary | ICD-10-CM | POA: Diagnosis not present

## 2022-01-28 DIAGNOSIS — K529 Noninfective gastroenteritis and colitis, unspecified: Secondary | ICD-10-CM

## 2022-01-28 DIAGNOSIS — F411 Generalized anxiety disorder: Secondary | ICD-10-CM

## 2022-01-28 MED ORDER — SERTRALINE HCL 100 MG PO TABS: 200.0000 mg | ORAL_TABLET | Freq: Every day | ORAL | 0 refills | Status: DC

## 2022-01-28 MED ORDER — ONDANSETRON HCL 4 MG PO TABS: 4.0000 mg | ORAL_TABLET | Freq: Three times a day (TID) | ORAL | 0 refills | Status: DC | PRN

## 2022-01-28 MED ORDER — DICYCLOMINE HCL 10 MG PO CAPS: 10.0000 mg | ORAL_CAPSULE | Freq: Three times a day (TID) | ORAL | 0 refills | Status: DC

## 2022-01-28 NOTE — Patient Instructions (Addendum)
Adenovirus Infection, Adult ?Adenoviruses are common viruses that cause many types of infections. These viruses may affect the nose, throat, windpipe, and lungs (respiratory system), as well as other parts of the body, including the eyes, stomach, bowels, bladder, and brain. The most common type of adenovirus infection is the common cold. ?Usually, adenovirus infections are not severe. However, they can become severe in people who have another health problem that makes it hard to fight off infection. ?What are the causes? ?This condition is caused by an adenovirus entering your body. Some ways this can happen are: ?Touching a surface or object that has an adenovirus on it and then touching your mouth, nose, or eyes with unwashed hands. ?Coming in close physical contact with someone who has this type of infection. This may happen if you hug or shake hands with the person. ?Breathing in droplets that fly through the air when someone who has the infection talks, coughs, or sneezes. ?Having contact with stool (feces) that has the virus in it. ?Using a swimming pool that does not have enough chlorine in it. Chlorine is a chemical that kills germs. ?Adenoviruses can live outside the body for a long time. They spread easily from person to person (are contagious). ?What increases the risk? ?The following factors may make you more likely to develop this condition: ?Spending a lot of time in places where there are many people. These include schools, summer camps, day care centers, community centers, and training centers for people who join the military. ?Being an older adult. ?Having a weak immune system. This is the body's defense system. ?Having a disease of the respiratory system. ?Having a heart condition. ?What are the signs or symptoms? ?Adenovirus infections usually cause flu-like symptoms. When the virus enters the body, symptoms of this condition can take up to 14 days to develop. Symptoms may include: ?Having lung  and breathing problems, such as: ?Cough. ?Trouble breathing. ?Runny nose or stuffy (congested) nose. ?Feeling aches and pains, including: ?Headache. ?Stiff neck. ?Sore throat. ?Ear pain or congested ears. ?Stomachache. ?Having digestive problems, such as: ?Feeling nauseous or vomiting. ?Having diarrhea. ?Having a fever. ?Having eye problems, such as pink eye (conjunctivitis), causing inflammation and redness. ?Rash. ?Less common symptoms include: ?Being confused or not knowing the time of day or where you are (disoriented). ?Having blood in your urine or having pain while urinating. ?How is this diagnosed? ?This condition may be diagnosed based on your symptoms and a physical exam. Your health care provider may order tests to make sure your symptoms are not caused by another problem. Tests can include: ?Blood tests. ?Urine tests. ?Stool tests. ?Chest X-ray. ?Tests of tissue or mucus from your throat. ?How is this treated? ?This condition goes away on its own with time. Treatment for this condition involves managing symptoms until they go away. Your health care provider may recommend: ?Getting plenty of rest. ?Drinking more fluids than usual. ?Taking over-the-counter medicine to help relieve a sore throat, fever, or headache. ?Follow these instructions at home: ?Lifestyle ?Do not drink alcohol. ?Do not use any products that contain nicotine or tobacco, such as cigarettes, e-cigarettes, and chewing tobacco. If you need help quitting, ask your health care provider. ?General instructions ?Take over-the-counter and prescription medicines only as told by your health care provider. ?Rest at home until your symptoms go away. ?Drink enough fluid to keep your urine pale yellow. ?If you have a sore throat, gargle with a salt-water mixture 3-4 times a day or as needed. To make   a salt-water mixture, completely dissolve ?-1 tsp (3-6 g) of salt in 1 cup (237 mL) of warm water. ?Keep all follow-up visits as told by your health care  provider. This is important. ?Return to your normal activities as told by your health care provider. Ask your health care provider what activities are safe for you. ?How is this prevented? ?  ?Adenoviruses often are not killed by cleaning products and can remain on surfaces for a long time. To help prevent becoming infected or spreading infection: ?Wash your hands often with soap and water. If soap and water are not available, use hand sanitizer. ?Cover your mouth when you cough. Cover your nose and mouth when you sneeze. ?Do not touch your eyes, nose, or mouth with unwashed hands, and wash hands after touching these areas. ?Clean commonly used objects often. ?Do not use a swimming pool that does not have enough chlorine in it. ?Avoid close contact with people who are sick. ?Do not go to school or work when you are sick. ?Do not share cups or eating utensils. ?Where to find more information ?Centers for Disease Control and Prevention: www.cdc.gov ?Contact a health care provider if: ?Your symptoms stay the same after 10 days. ?Your symptoms get worse. ?You cannot eat or drink without vomiting. ?Get help right away if: ?You have trouble breathing or you are breathing quickly. ?Your skin, lips, or fingernails look blue. ?Your heart is beating fast. ?You become confused. ?You lose consciousness. ?These symptoms may represent a serious problem that is an emergency. Do not wait to see if the symptoms will go away. Get medical help right away. Call your local emergency services (911 in the [place] Do not drive yourself to the hospital. ?Summary ?The most common type of adenovirus infection is the common cold. ?This condition goes away on its own with time. ?Adenoviruses can live outside the body for a long time. They spread easily from person to person (are contagious). ?Rest at home until your symptoms go away. ?Contact a health care provider if your symptoms stay the same after 10 days. ?This information is not intended  to replace advice given to you by your health care provider. Make sure you discuss any questions you have with your health care provider. ?Document Revised: 05/05/2019 Document Reviewed: 05/05/2019 ?Elsevier Patient Education ? 2022 Elsevier Inc. ? ?

## 2022-01-28 NOTE — Progress Notes (Signed)
? ? ? ?This visit occurred during the SARS-CoV-2 public health emergency.  Safety protocols were in place, including screening questions prior to the visit, additional usage of staff PPE, and extensive cleaning of exam room while observing appropriate contact time as indicated for disinfecting solutions.  ? ? ?Patricia Willis , 2001-04-26, 21 y.o., female ?MRN: [id number] ?Patient Care Team  ?  Relationship Specialty Notifications Start End  ?Ma Hillock, DO PCP - General Family Medicine  06/01/15   ?Modesto Charon, PA-C Physician Assistant Obstetrics and Gynecology  03/07/20   ? ? ?Chief Complaint  ?Patient presents with  ? Diarrhea  ?  Pt c/o n/v/d, epigastric abd pain, HA, x 5 days  ? ?  ?Subjective: Pt presents for an OV with complaints of nausea, vomiting, diarrhea and epigastric discomfort of 5 days duration.  Associated symptoms include headache. ?Pt has tried eating bland diet to ease their symptoms.  ?She has had a low-grade fever over this time.  She was unable to go to work Thursday-Sunday of last week.  She did go today but felt very uncomfortable.  He is now tolerating p.o. but feeling very nauseous. ? ? ?  01/28/2022  ?  3:50 PM 09/06/2020  ?  2:29 PM 09/06/2020  ?  2:18 PM 08/25/2019  ?  1:24 PM 03/04/2019  ? 10:04 AM  ?Depression screen PHQ 2/9  ?Decreased Interest 0 0 0 1 0  ?Down, Depressed, Hopeless 0 1 0 0 0  ?PHQ - 2 Score 0 1 0 1 0  ?Altered sleeping  0     ?Tired, decreased energy  1     ?Change in appetite  0     ?Feeling bad or failure about yourself   0     ?Trouble concentrating  1     ?Moving slowly or fidgety/restless  0     ?Suicidal thoughts  0     ?PHQ-9 Score  3     ? ? ?Allergies  ?Allergen Reactions  ? Keflex [Cephalexin]   ? ?Social History  ? ?Social History Narrative  ? Lives with mother only (has older brother that is an adult).  ? Wears her seatbelt, wears sunscreen.   ? Smoke detector in the home.   ? Feels safe in her relationships.   ? ?Past Medical History:  ?Diagnosis Date  ?  Allergic rhinitis   ? Anxiety with somatization   ? Atopic dermatitis   ? Constipation   ? Depression   ? Headache   ? Sleep disturbances   ? ?Past Surgical History:  ?Procedure Laterality Date  ? TONSILLECTOMY    ? ?Family History  ?Problem Relation Age of Onset  ? Hypertension Maternal Grandmother   ? ?Allergies as of 01/28/2022   ? ?   Reactions  ? Keflex [cephalexin]   ? ?  ? ?  ?Medication List  ?  ? ?  ? Accurate as of January 28, 2022  4:17 PM. If you have any questions, ask your nurse or doctor.  ?  ?  ? ?  ? ?dicyclomine 10 MG capsule ?Commonly known as: BENTYL ?Take 1 capsule (10 mg total) by mouth 4 (four) times daily -  before meals and at bedtime. ?Started by: Howard Pouch, DO ?  ?hydrocortisone 2.5 % cream ?APPLY TOPICALLY 2 (TWO) TIMES DAILY AS NEEDED. ?  ?ibuprofen 800 MG tablet ?Commonly known as: ADVIL ?Take 800 mg by mouth every 8 (eight) hours as needed. ?  ?magnesium oxide 400  MG tablet ?Commonly known as: MAG-OX ?Take 400 mg by mouth. ?  ?norgestrel-ethinyl estradiol 0.3-30 MG-MCG tablet ?Commonly known as: LO/OVRAL ?TAKE 1 TABLET BY MOUTH EVERY DAY ?  ?ondansetron 4 MG disintegrating tablet ?Commonly known as: ZOFRAN-ODT ?Take 4 mg by mouth every 8 (eight) hours as needed. ?  ?ondansetron 4 MG tablet ?Commonly known as: ZOFRAN ?Take 1 tablet (4 mg total) by mouth every 8 (eight) hours as needed for nausea or vomiting. ?Started by: Howard Pouch, DO ?  ?sertraline 100 MG tablet ?Commonly known as: Zoloft ?Take 2 tablets (200 mg total) by mouth daily. ?  ? ?  ? ? ?All past medical history, surgical history, allergies, family history, immunizations andmedications were updated in the EMR today and reviewed under the history and medication portions of their EMR.    ? ?ROS ?Negative, with the exception of above mentioned in HPI ? ? ?Objective:  ?BP 127/83   Pulse (!) 106   Temp 99.4 ?F (37.4 ?C) (Oral)   Ht 5\' 5"  (1.651 m)   Wt 141 lb (64 kg)   SpO2 99%   BMI 23.46 kg/m?  ?Body mass index is 23.46  kg/m?Marland Kitchen ?Physical Exam ?Vitals and nursing note reviewed.  ?Constitutional:   ?   General: She is not in acute distress. ?   Appearance: Normal appearance. She is normal weight. She is not ill-appearing or toxic-appearing.  ?Eyes:  ?   Extraocular Movements: Extraocular movements intact.  ?   Conjunctiva/sclera: Conjunctivae normal.  ?   Pupils: Pupils are equal, round, and reactive to light.  ?Cardiovascular:  ?   Rate and Rhythm: Regular rhythm. Tachycardia present.  ?   Heart sounds: No murmur heard. ?Pulmonary:  ?   Effort: Pulmonary effort is normal. No respiratory distress.  ?   Breath sounds: Normal breath sounds. No wheezing, rhonchi or rales.  ?Abdominal:  ?   General: Abdomen is flat. There is no distension.  ?   Palpations: Abdomen is soft.  ?   Tenderness: There is no abdominal tenderness. There is no guarding or rebound.  ?Lymphadenopathy:  ?   Cervical: No cervical adenopathy.  ?Skin: ?   Findings: No rash.  ?Neurological:  ?   Mental Status: She is alert and oriented to person, place, and time. Mental status is at baseline.  ?Psychiatric:     ?   Mood and Affect: Mood normal.     ?   Behavior: Behavior normal.     ?   Thought Content: Thought content normal.     ?   Judgment: Judgment normal.  ? ? ? ?No results found. ?No results found. ?No results found for this or any previous visit (from the past 24 hour(s)). ? ?Assessment/Plan: ?Patricia Willis is a 21 y.o. female present for OV for  ?Nausea and vomiting, unspecified vomiting type/gastroenteritis ?Rest, hydrate.  Water and low sugar Gatorade recommended. ?Bland diet, advanced as tolerated. ?Avoid dairy until symptoms are completely resolved. ?Bentyl prior to meals as needed ?Zofran every 8 hours first 48 hours and then as needed ? ?Generalized anxiety disorder ?Primary refill of 30 days called in until patient is able to schedule her routine follow-up. ?- sertraline (ZOLOFT) 100 MG tablet; Take 2 tablets (200 mg total) by mouth daily.  Dispense: 60  tablet; Refill: 0 ? ?Reviewed expectations re: course of current medical issues. ?Discussed self-management of symptoms. ?Outlined signs and symptoms indicating need for more acute intervention. ?Patient verbalized understanding and all questions were answered. ?Patient  received an Scientific laboratory technician. ? ? ? ?No orders of the defined types were placed in this encounter. ? ?Meds ordered this encounter  ?Medications  ? dicyclomine (BENTYL) 10 MG capsule  ?  Sig: Take 1 capsule (10 mg total) by mouth 4 (four) times daily -  before meals and at bedtime.  ?  Dispense:  30 capsule  ?  Refill:  0  ? ondansetron (ZOFRAN) 4 MG tablet  ?  Sig: Take 1 tablet (4 mg total) by mouth every 8 (eight) hours as needed for nausea or vomiting.  ?  Dispense:  20 tablet  ?  Refill:  0  ? DISCONTD: sertraline (ZOLOFT) 100 MG tablet  ?  Sig: Take 2 tablets (200 mg total) by mouth daily.  ?  Dispense:  30 tablet  ?  Refill:  0  ? sertraline (ZOLOFT) 100 MG tablet  ?  Sig: Take 2 tablets (200 mg total) by mouth daily.  ?  Dispense:  60 tablet  ?  Refill:  0  ?  Disregard #30 script- thanks  ? ?Referral Orders  ?No referral(s) requested today  ? ? ? ?Note is dictated utilizing voice recognition software. Although note has been proof read prior to signing, occasional typographical errors still can be missed. If any questions arise, please do not hesitate to call for verification.  ? ?electronically signed by: ? ?Howard Pouch, DO  ?[facility] [place] ? ? ? ?

## 2022-03-14 ENCOUNTER — Other Ambulatory Visit: Payer: Self-pay

## 2022-03-14 DIAGNOSIS — F411 Generalized anxiety disorder: Secondary | ICD-10-CM

## 2022-03-19 ENCOUNTER — Other Ambulatory Visit: Payer: Self-pay

## 2022-03-19 DIAGNOSIS — F411 Generalized anxiety disorder: Secondary | ICD-10-CM

## 2022-04-16 ENCOUNTER — Encounter: Payer: Self-pay | Admitting: Family Medicine

## 2022-04-16 ENCOUNTER — Ambulatory Visit: Payer: BC Managed Care – PPO | Admitting: Family Medicine

## 2022-04-16 VITALS — BP 132/81 | HR 71 | Temp 98.8°F | Ht 65.0 in | Wt 135.0 lb

## 2022-04-16 DIAGNOSIS — L2089 Other atopic dermatitis: Secondary | ICD-10-CM

## 2022-04-16 DIAGNOSIS — F411 Generalized anxiety disorder: Secondary | ICD-10-CM

## 2022-04-16 MED ORDER — SERTRALINE HCL 100 MG PO TABS: 100.0000 mg | ORAL_TABLET | Freq: Every day | ORAL | 1 refills | Status: DC

## 2022-04-16 MED ORDER — TRIAMCINOLONE ACETONIDE 0.1 % EX CREA: 1.0000 | TOPICAL_CREAM | Freq: Two times a day (BID) | CUTANEOUS | 5 refills | Status: AC

## 2022-04-16 NOTE — Progress Notes (Signed)
Patient Care Team    Relationship Specialty Notifications Start End  Natalia Leatherwood, DO PCP - General Family Medicine  06/01/15   Ardyth Gal, PA-C Physician Assistant Obstetrics and Gynecology  03/07/20     SUBJECTIVE Chief Complaint  Patient presents with   Anxiety    Pt is not fasting; cmc     HPI: Patricia Willis is a 21 y.o. female present for  Anxiety:  Crystalmarie reports she is feeling well on zoloft 100 mg qd. 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 She was able to by herself a new car ([employer] 2021). Feels happy and has been able to decrease the Zoloft from 200 to 100 mg over the last few months and feels she is still doing well. Prior note:  Presents for follow-up on her anxiety today. She states that she has been taking the 150 mg of Zoloft daily. She denies any negative side effects from the medication. She reports increased anxiety currently secondary to a relationship. She has been dating a young man for approximately one year, and she is having problems in that relationship. She has also graduated early this year in December, but will not walk with her class until June. She currently does not have a job and is deciding if she wants to attend college. She states she is not a "school type "of person and really does not want to attend college. She also states she would like to be involved in exercise science/physiology.      04/16/2022    2:27 PM 01/28/2022    3:50 PM 09/06/2020    2:29 PM 09/06/2020    2:18 PM 08/25/2019    1:24 PM  Depression screen PHQ 2/9  Decreased Interest 1 0 0 0 1  Down, Depressed, Hopeless 1 0 1 0 0  PHQ - 2 Score 2 0 1 0 1  Altered sleeping 0  0    Tired, decreased energy 1  1    Change in appetite 1  0    Feeling bad or failure about yourself  1  0    Trouble concentrating 0  1    Moving slowly or fidgety/restless 1  0    Suicidal thoughts 0  0    PHQ-9 Score 6  3          04/16/2022    2:27 PM 09/06/2020     2:29 PM 03/07/2020    9:20 AM 08/25/2019    1:24 PM  GAD 7 : Generalized Anxiety Score  Nervous, Anxious, on Edge 1 2 1  0  Control/stop worrying 1 1 0 0  Worry too much - different things 1 1 0 1  Trouble relaxing 0 0 0 1  Restless 0 0 0 0  Easily annoyed or irritable 2 1 2 2   Afraid - awful might happen 0 1 0 0  Total GAD 7 Score 5 6 3 4   Anxiety Difficulty   Somewhat difficult     ROS: See pertinent positives and negatives per HPI.    Patient Active Problem List   Diagnosis Date Noted   Marijuana use 02/04/2018   Atopic dermatitis 07/24/2015   Chronic headaches 06/01/2015   Generalized anxiety disorder 06/01/2015    Social History   Tobacco Use   Smoking status: Never   Smokeless tobacco: Never  Substance Use Topics   Alcohol use: No    Current Outpatient Medications:    hydrocortisone 2.5 %  cream, APPLY TOPICALLY 2 (TWO) TIMES DAILY AS NEEDED., Disp: 28.35 g, Rfl: 1   magnesium oxide (MAG-OX) 400 MG tablet, Take 400 mg by mouth., Disp: , Rfl:    norgestrel-ethinyl estradiol (LO/OVRAL) 0.3-30 MG-MCG tablet, TAKE 1 TABLET BY MOUTH EVERY DAY, Disp: , Rfl:    triamcinolone cream (KENALOG) 0.1 %, Apply 1 Application topically 2 (two) times daily., Disp: 30 g, Rfl: 5   sertraline (ZOLOFT) 100 MG tablet, Take 1 tablet (100 mg total) by mouth daily., Disp: 90 tablet, Rfl: 1  Allergies  Allergen Reactions   Keflex [Cephalexin]     OBJECTIVE: BP 132/81   Pulse 71   Temp 98.8 F (37.1 C) (Oral)   Ht 5\' 5"  (1.651 m)   Wt 135 lb (61.2 kg)   LMP 04/09/2022   SpO2 100%   BMI 22.47 kg/m  Physical Exam Vitals and nursing note reviewed.  Constitutional:      General: She is not in acute distress.    Appearance: Normal appearance. She is normal weight. She is not ill-appearing or toxic-appearing.  Eyes:     Extraocular Movements: Extraocular movements intact.     Conjunctiva/sclera: Conjunctivae normal.     Pupils: Pupils are equal, round, and reactive to light.   Neurological:     Mental Status: She is alert and oriented to person, place, and time. Mental status is at baseline.  Psychiatric:        Mood and Affect: Mood normal.        Behavior: Behavior normal.        Thought Content: Thought content normal.        Judgment: Judgment normal.     ASSESSMENT AND PLAN: Patricia Willis is a 21 y.o. female present for  Generalized anxiety disorder -Stable -Continue Zoloft 100 milligrams daily - Follow-up on 5.5 months on anxiety as long as doing well, sooner if needed.   Atopic dermatitis: Discussed rash is likely caused by the chemical irritant she is being exposed to at work.  Wearing gloves may be beneficial.  She feels it is worse after giving the dogs a bath. Kenalog cream twice daily prescribed  No orders of the defined types were placed in this encounter.  Meds ordered this encounter  Medications   sertraline (ZOLOFT) 100 MG tablet    Sig: Take 1 tablet (100 mg total) by mouth daily.    Dispense:  90 tablet    Refill:  1    Disregard #30 script- thanks   triamcinolone cream (KENALOG) 0.1 %    Sig: Apply 1 Application topically 2 (two) times daily.    Dispense:  30 g    Refill:  5   Referral Orders  No referral(s) requested today     30, DO 04/16/2022

## 2022-04-16 NOTE — Patient Instructions (Signed)
No follow-ups on file.        Great to see you today.  I have refilled the medication(s) we provide.   If labs were collected, we will inform you of lab results once received either by echart message or telephone call.   - echart message- for normal results that have been seen by the patient already.   - telephone call: abnormal results or if patient has not viewed results in their echart.  

## 2022-06-02 ENCOUNTER — Encounter (HOSPITAL_BASED_OUTPATIENT_CLINIC_OR_DEPARTMENT_OTHER): Payer: Self-pay | Admitting: Emergency Medicine

## 2022-06-02 ENCOUNTER — Other Ambulatory Visit: Payer: Self-pay

## 2022-06-02 ENCOUNTER — Emergency Department (HOSPITAL_BASED_OUTPATIENT_CLINIC_OR_DEPARTMENT_OTHER)
Admission: EM | Admit: 2022-06-02 | Discharge: 2022-06-02 | Disposition: A | Discharge status: Home / Self Care | Payer: BC Managed Care – PPO | Attending: Emergency Medicine | Admitting: Emergency Medicine

## 2022-06-02 DIAGNOSIS — U071 COVID-19: Secondary | ICD-10-CM | POA: Diagnosis not present

## 2022-06-02 DIAGNOSIS — R5383 Other fatigue: Secondary | ICD-10-CM | POA: Diagnosis not present

## 2022-06-02 DIAGNOSIS — D72819 Decreased white blood cell count, unspecified: Secondary | ICD-10-CM | POA: Insufficient documentation

## 2022-06-02 LAB — BASIC METABOLIC PANEL
Anion gap: 7 (ref 5–15)
BUN: 12 mg/dL (ref 6–20)
CO2: 28 mmol/L (ref 22–32)
Calcium: 9.2 mg/dL (ref 8.9–10.3)
Chloride: 105 mmol/L (ref 98–111)
Creatinine, Ser: 1.03 mg/dL — ABNORMAL HIGH (ref 0.44–1.00)
GFR, Estimated: >60 mL/min (ref >60)
Glucose, Bld: 99 mg/dL (ref 70–99)
Potassium: 3.8 mmol/L (ref 3.5–5.1)
Sodium: 140 mmol/L (ref 135–145)

## 2022-06-02 LAB — CBC
HCT: 46.4 % — ABNORMAL HIGH (ref 36.0–46.0)
Hemoglobin: 15.7 g/dL — ABNORMAL HIGH (ref 12.0–15.0)
MCH: 31.6 pg (ref 26.0–34.0)
MCHC: 33.8 g/dL (ref 30.0–36.0)
MCV: 93.4 fL (ref 80.0–100.0)
Platelets: 128 10*3/uL — ABNORMAL LOW (ref 150–400)
RBC: 4.97 MIL/uL (ref 3.87–5.11)
RDW: 11.9 % (ref 11.5–15.5)
WBC: 3 10*3/uL — ABNORMAL LOW (ref 4.0–10.5)
nRBC: 0 % (ref 0.0–0.2)

## 2022-06-02 LAB — URINALYSIS, ROUTINE W REFLEX MICROSCOPIC
Glucose, UA: NEGATIVE mg/dL
Hgb urine dipstick: NEGATIVE
Ketones, ur: NEGATIVE mg/dL
Leukocytes,Ua: NEGATIVE
Nitrite: NEGATIVE
Protein, ur: NEGATIVE mg/dL
Specific Gravity, Urine: 1.025 (ref 1.005–1.030)
pH: 7 (ref 5.0–8.0)

## 2022-06-02 LAB — HEPATIC FUNCTION PANEL
ALT: 92 U/L — ABNORMAL HIGH (ref 0–44)
AST: 138 U/L — ABNORMAL HIGH (ref 15–41)
Albumin: 4.4 g/dL (ref 3.5–5.0)
Alkaline Phosphatase: 53 U/L (ref 38–126)
Bilirubin, Direct: 0.1 mg/dL (ref 0.0–0.2)
Indirect Bilirubin: 0.5 mg/dL (ref 0.3–0.9)
Total Bilirubin: 0.6 mg/dL (ref 0.3–1.2)
Total Protein: 8 g/dL (ref 6.5–8.1)

## 2022-06-02 LAB — PREGNANCY, URINE: Preg Test, Ur: NEGATIVE

## 2022-06-02 LAB — LIPASE, BLOOD: Lipase: 29 U/L (ref 11–51)

## 2022-06-02 MED ORDER — ONDANSETRON HCL 4 MG/2ML IJ SOLN
4.0000 mg | Freq: Once | INTRAMUSCULAR | Status: AC
Start: 2022-06-02 — End: 2022-06-02
  Administered 2022-06-02: 4 mg via INTRAVENOUS
  Filled 2022-06-02: qty 2

## 2022-06-02 MED ORDER — ONDANSETRON 4 MG PO TBDP: 4.0000 mg | ORAL_TABLET | Freq: Three times a day (TID) | ORAL | 0 refills | Status: DC | PRN

## 2022-06-02 MED ORDER — SODIUM CHLORIDE 0.9 % IV BOLUS
1000.0000 mL | Freq: Once | INTRAVENOUS | Status: AC
  Administered 2022-06-02: 1000 mL via INTRAVENOUS

## 2022-06-02 NOTE — ED Triage Notes (Signed)
Pt arrives pov, endorses covid +, c/o  weakness, nausea and concern for dehydration d/t decreased po intake

## 2022-06-02 NOTE — Discharge Instructions (Addendum)
You came to the emergency department today to be evaluated for your fatigue and nausea after testing positive for COVID-19.  Your symptoms are likely due to COVID-19 and should gradually prove time.  I have given you prescription for Zofran which you may take every 8 hours to help with nausea and vomiting.  Your lab work today showed that your liver function was slightly elevated.  Please follow-up closely with your primary care doctor later this week to have your labs rechecked.  Please avoid any Tylenol until your labs can be rechecked.  Get help right away if: You have pain in your chest, neck, arm, or jaw. You feel extremely weak or you faint. You have vomit that is bright red or looks like coffee grounds. You have bloody or black stools (feces) or stools that look like tar. You have a severe headache, a stiff neck, or both. You have severe pain, cramping, or bloating in your abdomen. You have difficulty breathing or are breathing very quickly. Your heart is beating very quickly. Your skin feels cold and clammy. You feel confused. You have signs of dehydration, such as: Dark urine, very little urine, or no urine. Cracked lips. Dry mouth. Sunken eyes. Sleepiness. Weakness.

## 2022-06-02 NOTE — ED Provider Notes (Signed)
[facility] EMERGENCY DEPARTMENT Provider Note   CSN: [id number] Arrival date & time: 06/02/22  1507     History  Chief Complaint  Patient presents with   Fatigue    Patricia Willis is a 21 y.o. female with medical history of allergic rhinitis, anxiety, depression, atopic dermatitis.  Presents to the emergency department with a chief complaint of nausea and generalized fatigue.  Patient states that she tested positive for COVID-19 on a home test on Tuesday, 05/28/2022.  Patient reports that she has not been vaccinated for COVID-19.  Patient reports that she had nausea and vomiting starting with her positive test result.  Patient reports that she has not been vomiting however still has felt nauseous.  Patient has not vomited in the last 24 hours.  Due to her nausea she has not been able to tolerate much p.o. intake and expresses concern that she might be dehydrated.  Patient also reports that she has had generalized abdominal cramping.  Denies any pain at present.  Patient reports that she has had a minimally nonproductive cough.  Patient also reports that she has had generalized fatigue and chills.  Patient denies any fever, chest pain, shortness of breath, diarrhea, constipation, blood in stool, melena, dysuria, hematuria, urinary urgency, urinary frequency, vaginal pain, vaginal bleeding, vaginal discharge, lightheadedness, syncope.  Patient denies any alcohol use.  Endorses marijuana use.  Denies any history of abdominal surgery.  HPI     Home Medications Prior to Admission medications   Medication Sig Start Date End Date Taking? Authorizing Provider  hydrocortisone 2.5 % cream APPLY TOPICALLY 2 (TWO) TIMES DAILY AS NEEDED. 01/18/19   Kuneff, Renee A, DO  magnesium oxide (MAG-OX) 400 MG tablet Take 400 mg by mouth.    [provider]  norgestrel-ethinyl estradiol (LO/OVRAL) 0.3-30 MG-MCG tablet TAKE 1 TABLET BY MOUTH EVERY DAY 08/23/19   [provider]   sertraline (ZOLOFT) 100 MG tablet Take 1 tablet (100 mg total) by mouth daily. 04/16/22   Kuneff, Renee A, DO  triamcinolone cream (KENALOG) 0.1 % Apply 1 Application topically 2 (two) times daily. 04/16/22   Kuneff, Renee A, DO      Allergies    Keflex [cephalexin]    Review of Systems   Review of Systems  Constitutional:  Positive for chills and fatigue. Negative for fever.  Eyes:  Negative for visual disturbance.  Respiratory:  Positive for cough. Negative for shortness of breath.   Cardiovascular:  Negative for chest pain.  Gastrointestinal:  Positive for abdominal pain, nausea and vomiting. Negative for abdominal distention, anal bleeding, blood in stool, constipation, diarrhea and rectal pain.  Genitourinary:  Negative for difficulty urinating, dysuria, flank pain, frequency, hematuria, vaginal bleeding, vaginal discharge and vaginal pain.  Musculoskeletal:  Negative for back pain and neck pain.  Skin:  Negative for color change and rash.  Neurological:  Negative for dizziness, syncope, light-headedness and headaches.  Psychiatric/Behavioral:  Negative for confusion.     Physical Exam Updated Vital Signs BP (!) 133/97   Pulse 75   Temp 98.9 F (37.2 C) (Oral)   Resp 20   Ht 5\' 5"  (1.651 m)   Wt 58.1 kg   LMP 05/12/2022   SpO2 97%   BMI 21.30 kg/m  Physical Exam Vitals and nursing note reviewed.  Constitutional:      General: She is not in acute distress.    Appearance: She is not ill-appearing, toxic-appearing or diaphoretic.  HENT:     Head: Normocephalic.  Eyes:     General: No scleral icterus.       Right eye: No discharge.        Left eye: No discharge.  Cardiovascular:     Rate and Rhythm: Normal rate.  Pulmonary:     Effort: Pulmonary effort is normal. No tachypnea, bradypnea or respiratory distress.     Breath sounds: Normal breath sounds. No stridor.  Abdominal:     General: Abdomen is flat. Bowel sounds are normal. There is no distension. There are no  signs of injury.     Palpations: Abdomen is soft. There is no mass or pulsatile mass.     Tenderness: There is no abdominal tenderness. There is no guarding or rebound.     Hernia: There is no hernia in the umbilical area or ventral area.  Skin:    General: Skin is warm and dry.  Neurological:     General: No focal deficit present.     Mental Status: She is alert and oriented to person, place, and time.     GCS: GCS eye subscore is 4. GCS verbal subscore is 5. GCS motor subscore is 6.  Psychiatric:        Behavior: Behavior is cooperative.     ED Results / Procedures / Treatments   Labs (all labs ordered are listed, but only abnormal results are displayed) Labs Reviewed  BASIC METABOLIC PANEL - Abnormal; Notable for the following components:      Result Value   Creatinine, Ser 1.03 (*)    All other components within normal limits  CBC - Abnormal; Notable for the following components:   WBC 3.0 (*)    Hemoglobin 15.7 (*)    HCT 46.4 (*)    Platelets 128 (*)    All other components within normal limits  URINALYSIS, ROUTINE W REFLEX MICROSCOPIC - Abnormal; Notable for the following components:   Bilirubin Urine SMALL (*)    All other components within normal limits  HEPATIC FUNCTION PANEL - Abnormal; Notable for the following components:   AST 138 (*)    ALT 92 (*)    All other components within normal limits  PREGNANCY, URINE  LIPASE, BLOOD    EKG None  Radiology No results found.  Procedures Procedures    Medications Ordered in ED Medications  sodium chloride 0.9 % bolus 1,000 mL (1,000 mLs Intravenous New Bag/Given 06/02/22 1657)  ondansetron (ZOFRAN) injection 4 mg (4 mg Intravenous Given 06/02/22 1659)    ED Course/ Medical Decision Making/ A&P                           Medical Decision Making Amount and/or Complexity of Data Reviewed Labs: ordered.  Risk Prescription drug management.   Alert 21 year old female no acute distress, nontoxic-appearing.   Presents to the ED with chief complaint of fatigue and nausea/vomiting.  Information obtained from patient.  I reviewed patient's past medical records including previous prior notes, labs, and imaging.  Patient has medical history as outlined in HPI with complicates her care.  Patient reports generalized abdominal cramping, nausea, and vomiting in setting of positive COVID-19 testing.  Suspect that patient's symptoms are secondary to her COVID-19 infection.  BMP, CBC, urine pregnancy, and UA were obtained while patient was in triage.  Additionally will add on hepatic function and lipase.  We will give patient 1 L fluid bolus and Zofran.  I personally viewed interpret patient's lab results.  Pertinent  findings include: -AST 138, ALT 92; total bili within normal limits -Leukopenia at 3.0 -Creatinine 1.03 -Urine pregnancy test negative -UA unremarkable  Leukopenia likely secondary to patient's COVID-19 infection AST and ALT noted to be elevated.  Patient reports that she has not had any Tylenol within the last 48 hours.  Hepatobiliary dysfunction was considered however low suspicion at this time as patient has no right upper quadrant tenderness.  We will have patient follow-up with PCP later this week for repeat testing.  Patient is able to tolerate p.o. intake without difficulty.  Will prescribe patient with Zofran.  Based on patient's chief complaint, I considered admission might be necessary, however after reassuring ED workup feel patient is reasonable for discharge.  Discussed results, findings, treatment and follow up. Patient advised of return precautions. Patient verbalized understanding and agreed with plan.  Portions of this note were generated with Scientist, clinical (histocompatibility and immunogenetics). Dictation errors may occur despite best attempts at proofreading.         Final Clinical Impression(s) / ED Diagnoses Final diagnoses:  COVID-19    Rx / DC Orders ED Discharge Orders          Ordered     ondansetron (ZOFRAN-ODT) 4 MG disintegrating tablet  Every 8 hours PRN        06/02/22 1930              Haskel Schroeder, PA-C 06/02/22 1933    Derwood Kaplan, MD 06/02/22 2144

## 2022-06-06 ENCOUNTER — Encounter: Payer: Self-pay | Admitting: Family Medicine

## 2022-06-07 ENCOUNTER — Ambulatory Visit: Payer: BC Managed Care – PPO | Admitting: Family Medicine

## 2022-06-07 ENCOUNTER — Encounter: Payer: Self-pay | Admitting: Family Medicine

## 2022-06-07 VITALS — BP 110/79 | HR 124 | Temp 98.4°F | Ht 65.0 in | Wt 123.0 lb

## 2022-06-07 DIAGNOSIS — U071 COVID-19: Secondary | ICD-10-CM | POA: Diagnosis not present

## 2022-06-07 DIAGNOSIS — R051 Acute cough: Secondary | ICD-10-CM

## 2022-06-07 MED ORDER — PREDNISONE 20 MG PO TABS: ORAL_TABLET | ORAL | 0 refills | Status: DC

## 2022-06-07 MED ORDER — AZITHROMYCIN 250 MG PO TABS: ORAL_TABLET | ORAL | 0 refills | Status: AC

## 2022-06-07 NOTE — Progress Notes (Unsigned)
Patricia Willis , 2001-05-07, 21 y.o., female MRN: [id number] Patient Care Team    Relationship Specialty Notifications Start End  Patricia Hillock, DO PCP - General Family Medicine  06/01/15   Patricia Charon, PA-C Physician Assistant Obstetrics and Gynecology  03/07/20     Chief Complaint  Patient presents with   Covid Positive    Pt reports still having HA, weakness, and loss of appetite      Subjective: Pt presents for an OV with complaints of of COVID-positive illness.  Patient reports she still feeling rather miserable.  Believes she was exposed during a wedding on the weekend of 05/25/2022.  She tested + 05/28/2022.  She had not been treated with antiviral secondary to it being greater then 5 days of onset.  She has not had her COVID vaccines. She reports continued headache, feeling weak and loss of appetite.  She is having nasal drainage and a cough.  She denies any continued fever.      06/07/2022    2:39 PM 04/16/2022    2:27 PM 01/28/2022    3:50 PM 09/06/2020    2:29 PM 09/06/2020    2:18 PM  Depression screen PHQ 2/9  Decreased Interest 3 1 0 0 0  Down, Depressed, Hopeless 3 1 0 1 0  PHQ - 2 Score 6 2 0 1 0  Altered sleeping 2 0  0   Tired, decreased energy '3 1  1   ' Change in appetite 3 1  0   Feeling bad or failure about yourself  2 1  0   Trouble concentrating 2 0  1   Moving slowly or fidgety/restless 1 1  0   Suicidal thoughts 2 0  0   PHQ-9 Score '21 6  3     ' Allergies  Allergen Reactions   Keflex [Cephalexin]    Social History   Social History Narrative   Lives with mother only (has older brother that is an adult).   Wears her seatbelt, wears sunscreen.    Smoke detector in the home.    Feels safe in her relationships.    Past Medical History:  Diagnosis Date   Allergic rhinitis    Anxiety with somatization    Atopic dermatitis    Constipation    Depression    Headache    Sleep disturbances    Past Surgical History:  Procedure Laterality Date    TONSILLECTOMY     Family History  Problem Relation Age of Onset   Hypertension Maternal Grandmother    Allergies as of 06/07/2022       Reactions   Keflex [cephalexin]         Medication List        Accurate as of June 07, 2022 11:59 PM. If you have any questions, ask your nurse or doctor.          STOP taking these medications    hydrocortisone 2.5 % cream Stopped by: Patricia Pouch, DO       TAKE these medications    azithromycin 250 MG tablet Commonly known as: ZITHROMAX Take 2 tablets on day 1, then 1 tablet daily on days 2 through 5 Started by: Patricia Pouch, DO   magnesium oxide 400 MG tablet Commonly known as: MAG-OX Take 400 mg by mouth.   norgestrel-ethinyl estradiol 0.3-30 MG-MCG tablet Commonly known as: LO/OVRAL TAKE 1 TABLET BY MOUTH EVERY DAY   ondansetron 4 MG disintegrating tablet Commonly known  as: ZOFRAN-ODT Take 1 tablet (4 mg total) by mouth every 8 (eight) hours as needed for nausea or vomiting.   predniSONE 20 MG tablet Commonly known as: DELTASONE 60 mg x3d, 40 mg x3d, 20 mg x2d, 10 mg x2d Started by: Patricia Pouch, DO   sertraline 100 MG tablet Commonly known as: Zoloft Take 1 tablet (100 mg total) by mouth daily.   triamcinolone cream 0.1 % Commonly known as: KENALOG Apply 1 Application topically 2 (two) times daily.        All past medical history, surgical history, allergies, family history, immunizations andmedications were updated in the EMR today and reviewed under the history and medication portions of their EMR.     ROS Negative, with the exception of above mentioned in HPI   Objective:  BP 110/79   Pulse (!) 124   Temp 98.4 F (36.9 C) (Oral)   Ht '5\' 5"'  (1.651 m)   Wt 123 lb (55.8 kg)   LMP 06/04/2022   SpO2 94%   BMI 20.47 kg/m  Body mass index is 20.47 kg/m. Physical Exam Vitals and nursing note reviewed.  Constitutional:      General: She is not in acute distress.    Appearance: Normal  appearance. She is ill-appearing. She is not toxic-appearing or diaphoretic.  HENT:     Head: Normocephalic and atraumatic.     Right Ear: Tympanic membrane and ear canal normal.     Left Ear: Tympanic membrane and ear canal normal.     Nose: Congestion and rhinorrhea present.     Mouth/Throat:     Pharynx: Posterior oropharyngeal erythema present. No oropharyngeal exudate.  Eyes:     General: No scleral icterus.       Right eye: No discharge.        Left eye: No discharge.     Extraocular Movements: Extraocular movements intact.     Conjunctiva/sclera: Conjunctivae normal.     Pupils: Pupils are equal, round, and reactive to light.  Cardiovascular:     Rate and Rhythm: Regular rhythm. Tachycardia present.  Pulmonary:     Effort: Pulmonary effort is normal. No respiratory distress.     Breath sounds: Rhonchi present. No wheezing or rales.  Musculoskeletal:     Cervical back: Neck supple. No tenderness.  Lymphadenopathy:     Cervical: No cervical adenopathy.  Skin:    General: Skin is warm and dry.     Coloration: Skin is not jaundiced or pale.     Findings: No erythema or rash.  Neurological:     Mental Status: She is alert and oriented to person, place, and time. Mental status is at baseline.     Motor: No weakness.     Gait: Gait normal.  Psychiatric:        Mood and Affect: Mood normal.        Behavior: Behavior normal.        Thought Content: Thought content normal.        Judgment: Judgment normal.      No results found. No results found. No results found for this or any previous visit (from the past 24 hour(s)).  Assessment/Plan: Patricia Willis is a 21 y.o. female present for OV for  COVID illness/cough Rest, hydrate.  She appears dehydrated today.  She is tachycardic. +/- flonase, mucinex (DM if cough), nettie pot or nasal saline.  Then prednisone prescribed, take until completed.  Reviewed home care instructions for COVID. Advised self-isolation at home for  at least 5 days. After 5 days, if improved and fever resolved, can be in public, but should wear a mask around others for an additional 5 days. If symptoms, esp, dyspnea develops/worsens, recommend in-person evaluation at either an urgent care or the emergency room. Follow-up in 2 weeks if not seeing improvement  Reviewed expectations re: course of current medical issues. Discussed self-management of symptoms. Outlined signs and symptoms indicating need for more acute intervention. Patient verbalized understanding and all questions were answered. Patient received an After-Visit Summary.    Orders Placed This Encounter  Procedures   CBC w/Diff   Comp Met (CMET)   Meds ordered this encounter  Medications   predniSONE (DELTASONE) 20 MG tablet    Sig: 60 mg x3d, 40 mg x3d, 20 mg x2d, 10 mg x2d    Dispense:  18 tablet    Refill:  0   azithromycin (ZITHROMAX) 250 MG tablet    Sig: Take 2 tablets on day 1, then 1 tablet daily on days 2 through 5    Dispense:  6 tablet    Refill:  0   Referral Orders  No referral(s) requested today     Note is dictated utilizing voice recognition software. Although note has been proof read prior to signing, occasional typographical errors still can be missed. If any questions arise, please do not hesitate to call for verification.   electronically signed by:  Patricia Pouch, DO  [place]

## 2022-06-07 NOTE — Patient Instructions (Signed)
Start z-pack and prednisone.  Mucinex can help with drainage.   Hydrate!!! At least 80 ounces or more a day of water and electrolyte replacement drinks.

## 2022-06-08 LAB — COMPREHENSIVE METABOLIC PANEL
AG Ratio: 1.8 (calc) (ref 1.0–2.5)
ALT: 87 U/L — ABNORMAL HIGH (ref 6–29)
AST: 30 U/L (ref 10–30)
Albumin: 4.9 g/dL (ref 3.6–5.1)
Alkaline phosphatase (APISO): 64 U/L (ref 31–125)
BUN/Creatinine Ratio: 16 (calc) (ref 6–22)
BUN: 16 mg/dL (ref 7–25)
CO2: 26 mmol/L (ref 20–32)
Calcium: 10.3 mg/dL — ABNORMAL HIGH (ref 8.6–10.2)
Chloride: 98 mmol/L (ref 98–110)
Creat: 1.01 mg/dL — ABNORMAL HIGH (ref 0.50–0.96)
Globulin: 2.8 g/dL (calc) (ref 1.9–3.7)
Glucose, Bld: 83 mg/dL (ref 65–99)
Potassium: 4.9 mmol/L (ref 3.5–5.3)
Sodium: 139 mmol/L (ref 135–146)
Total Bilirubin: 0.8 mg/dL (ref 0.2–1.2)
Total Protein: 7.7 g/dL (ref 6.1–8.1)

## 2022-06-08 LAB — CBC WITH DIFFERENTIAL/PLATELET
Absolute Monocytes: 343 cells/uL (ref 200–950)
Basophils Absolute: 9 cells/uL (ref 0–200)
Basophils Relative: 0.2 %
Eosinophils Absolute: 0 cells/uL — ABNORMAL LOW (ref 15–500)
Eosinophils Relative: 0 %
HCT: 47.6 % — ABNORMAL HIGH (ref 35.0–45.0)
Hemoglobin: 16.8 g/dL — ABNORMAL HIGH (ref 11.7–15.5)
Lymphs Abs: 837 cells/uL — ABNORMAL LOW (ref 850–3900)
MCH: 32.7 pg (ref 27.0–33.0)
MCHC: 35.3 g/dL (ref 32.0–36.0)
MCV: 92.6 fL (ref 80.0–100.0)
MPV: 11.3 fL (ref 7.5–12.5)
Monocytes Relative: 7.3 %
Neutro Abs: 3511 cells/uL (ref 1500–7800)
Neutrophils Relative %: 74.7 %
Platelets: 231 10*3/uL (ref 140–400)
RBC: 5.14 10*6/uL — ABNORMAL HIGH (ref 3.80–5.10)
RDW: 11.7 % (ref 11.0–15.0)
Total Lymphocyte: 17.8 %
WBC: 4.7 10*3/uL (ref 3.8–10.8)

## 2022-06-10 ENCOUNTER — Telehealth: Payer: Self-pay | Admitting: Family Medicine

## 2022-06-10 ENCOUNTER — Encounter: Payer: Self-pay | Admitting: Family Medicine

## 2022-06-10 NOTE — Telephone Encounter (Signed)
Please inform patient Her blood cell counts, kidney function and liver function are returning to normal.  However her labs do appear she is significantly dehydrated. Start Encouraged her to increase her hydration to at least 80-100 ounces a day, more if able to catch up on her dehydration.

## 2022-06-10 NOTE — Telephone Encounter (Signed)
Spoke with pt regarding labs and instructions.   

## 2022-08-01 ENCOUNTER — Encounter: Payer: Self-pay | Admitting: Family Medicine

## 2022-08-01 ENCOUNTER — Telehealth: Payer: BC Managed Care – PPO | Admitting: Physician Assistant

## 2022-08-01 DIAGNOSIS — J069 Acute upper respiratory infection, unspecified: Secondary | ICD-10-CM

## 2022-08-01 MED ORDER — BENZONATATE 100 MG PO CAPS: 100.0000 mg | ORAL_CAPSULE | Freq: Three times a day (TID) | ORAL | 0 refills | Status: DC | PRN

## 2022-08-01 NOTE — Progress Notes (Signed)

## 2022-08-01 NOTE — Progress Notes (Signed)
I have spent 5 minutes in review of e-visit questionnaire, review and updating patient chart, medical decision making and response to patient.   Lama Narayanan Cody Rossana Molchan, PA-C    

## 2022-08-02 ENCOUNTER — Encounter: Payer: Self-pay | Admitting: Family Medicine

## 2022-08-02 ENCOUNTER — Ambulatory Visit: Payer: BC Managed Care – PPO | Admitting: Family Medicine

## 2022-08-02 VITALS — BP 125/85 | HR 87 | Temp 98.5°F | Wt 134.8 lb

## 2022-08-02 DIAGNOSIS — K529 Noninfective gastroenteritis and colitis, unspecified: Secondary | ICD-10-CM | POA: Diagnosis not present

## 2022-08-02 DIAGNOSIS — R0981 Nasal congestion: Secondary | ICD-10-CM | POA: Diagnosis not present

## 2022-08-02 LAB — POC COVID19 BINAXNOW: SARS Coronavirus 2 Ag: NEGATIVE

## 2022-08-02 LAB — POCT INFLUENZA A/B
Influenza A, POC: NEGATIVE
Influenza B, POC: NEGATIVE

## 2022-08-02 MED ORDER — ONDANSETRON HCL 4 MG PO TABS: 4.0000 mg | ORAL_TABLET | Freq: Three times a day (TID) | ORAL | 0 refills | Status: DC | PRN

## 2022-08-02 MED ORDER — DICYCLOMINE HCL 20 MG PO TABS: 20.0000 mg | ORAL_TABLET | Freq: Four times a day (QID) | ORAL | 0 refills | Status: DC

## 2022-08-02 NOTE — Progress Notes (Signed)
Patricia Willis , 2001/10/01, 21 y.o., female MRN: [id number] Patient Care Team    Relationship Specialty Notifications Start End  Ma Hillock, DO PCP - General Family Medicine  06/01/15   [place] Charon, PA-C Physician Assistant Obstetrics and Gynecology  03/07/20     Chief Complaint  Patient presents with   Nasal Congestion    Started over the weekend. Night sweats. Vomiting and diarrhea      Subjective: Pt presents for an OV with complaints of nasal congestion and sinus symptoms started last weekend, which have started to improve when she experienced nausea, vomit and diarrhea that started Wednesday. She has not been able to eat much since.      06/07/2022    2:39 PM 04/16/2022    2:27 PM 01/28/2022    3:50 PM 09/06/2020    2:29 PM 09/06/2020    2:18 PM  Depression screen PHQ 2/9  Decreased Interest 3 1 0 0 0  Down, Depressed, Hopeless 3 1 0 1 0  PHQ - 2 Score 6 2 0 1 0  Altered sleeping 2 0  0   Tired, decreased energy 3 1  1    Change in appetite 3 1  0   Feeling bad or failure about yourself  2 1  0   Trouble concentrating 2 0  1   Moving slowly or fidgety/restless 1 1  0   Suicidal thoughts 2 0  0   PHQ-9 Score 21 6  3      Allergies  Allergen Reactions   Keflex [Cephalexin]    Social History   Social History Narrative   Lives with mother only (has older brother that is an adult).   Wears her seatbelt, wears sunscreen.    Smoke detector in the home.    Feels safe in her relationships.    Past Medical History:  Diagnosis Date   Allergic rhinitis    Anxiety with somatization    Atopic dermatitis    Constipation    Depression    Headache    Sleep disturbances    Past Surgical History:  Procedure Laterality Date   TONSILLECTOMY     Family History  Problem Relation Age of Onset   Hypertension Maternal Grandmother    Allergies as of 08/02/2022       Reactions   Keflex [cephalexin]         Medication List        Accurate as of August 02, 2022  2:42 PM. If you have any questions, ask your nurse or doctor.          STOP taking these medications    benzonatate 100 MG capsule Commonly known as: TESSALON Stopped by: Howard Pouch, DO   ondansetron 4 MG disintegrating tablet Commonly known as: ZOFRAN-ODT Stopped by: Howard Pouch, DO   predniSONE 20 MG tablet Commonly known as: DELTASONE Stopped by: Howard Pouch, DO       TAKE these medications    dicyclomine 20 MG tablet Commonly known as: BENTYL Take 1 tablet (20 mg total) by mouth every 6 (six) hours. Started by: Howard Pouch, DO   magnesium oxide 400 MG tablet Commonly known as: MAG-OX Take 400 mg by mouth.   norgestrel-ethinyl estradiol 0.3-30 MG-MCG tablet Commonly known as: LO/OVRAL TAKE 1 TABLET BY MOUTH EVERY DAY   ondansetron 4 MG tablet Commonly known as: ZOFRAN Take 1 tablet (4 mg total) by mouth every 8 (eight) hours as needed for  nausea or vomiting. Started by: Felix Pacini, DO   sertraline 100 MG tablet Commonly known as: Zoloft Take 1 tablet (100 mg total) by mouth daily.   triamcinolone cream 0.1 % Commonly known as: KENALOG Apply 1 Application topically 2 (two) times daily.        All past medical history, surgical history, allergies, family history, immunizations andmedications were updated in the EMR today and reviewed under the history and medication portions of their EMR.     Review of Systems  Constitutional:  Positive for diaphoresis and malaise/fatigue. Negative for chills and fever.  HENT:  Negative for sore throat.   Respiratory:  Negative for cough, sputum production, shortness of breath and wheezing.   Gastrointestinal:  Positive for diarrhea, nausea and vomiting. Negative for abdominal pain.  Neurological:  Negative for dizziness and headaches.   Negative, with the exception of above mentioned in HPI   Objective:  BP 125/85   Pulse 87   Temp 98.5 F (36.9 C)   Wt 134 lb 12.8 oz (61.1 kg)   LMP 07/30/2022    SpO2 96%   BMI 22.43 kg/m  Body mass index is 22.43 kg/m. Physical Exam Vitals and nursing note reviewed.  Constitutional:      General: She is not in acute distress.    Appearance: Normal appearance. She is not ill-appearing, toxic-appearing or diaphoretic.  HENT:     Head: Normocephalic and atraumatic.     Right Ear: Tympanic membrane normal.     Left Ear: Tympanic membrane normal.     Nose: Congestion and rhinorrhea present.     Mouth/Throat:     Mouth: Mucous membranes are moist.     Pharynx: No oropharyngeal exudate or posterior oropharyngeal erythema.  Eyes:     General: No scleral icterus.       Right eye: No discharge.        Left eye: No discharge.     Extraocular Movements: Extraocular movements intact.     Conjunctiva/sclera: Conjunctivae normal.     Pupils: Pupils are equal, round, and reactive to light.  Cardiovascular:     Rate and Rhythm: Normal rate.  Pulmonary:     Effort: Pulmonary effort is normal. No respiratory distress.     Breath sounds: Normal breath sounds. No wheezing, rhonchi or rales.  Abdominal:     General: Abdomen is flat. Bowel sounds are normal. There is no distension.     Palpations: Abdomen is soft. There is no mass.     Tenderness: There is no abdominal tenderness. There is no guarding or rebound.  Skin:    Coloration: Skin is not jaundiced or pale.     Findings: No erythema or rash.  Neurological:     Mental Status: She is alert and oriented to person, place, and time. Mental status is at baseline.     Motor: No weakness.     Gait: Gait normal.  Psychiatric:        Mood and Affect: Mood normal.        Behavior: Behavior normal.        Thought Content: Thought content normal.        Judgment: Judgment normal.     No results found. No results found. Results for orders placed or performed in visit on 08/02/22 (from the past 24 hour(s))  POCT Influenza A/B     Status: None   Collection Time: 08/02/22  1:30 PM  Result Value Ref  Range   Influenza A,  POC Negative Negative   Influenza B, POC Negative Negative  POC COVID-19 BinaxNow     Status: None   Collection Time: 08/02/22  1:30 PM  Result Value Ref Range   SARS Coronavirus 2 Ag Negative Negative    Assessment/Plan: Patricia Willis is a 21 y.o. female present for OV for  Nasal congestion//Gastroenteritis - POCT Influenza A/B>neg - POC COVID-19 BinaxNow> neg Zofran and bentyl prescribed.  Avoid dairy until sx resolve Rest, HYDRATE F/u prn Reviewed expectations re: course of current medical issues. Discussed self-management of symptoms. Outlined signs and symptoms indicating need for more acute intervention. Patient verbalized understanding and all questions were answered. Patient received an After-Visit Summary.    Orders Placed This Encounter  Procedures   POCT Influenza A/B   POC COVID-19 BinaxNow   Meds ordered this encounter  Medications   ondansetron (ZOFRAN) 4 MG tablet    Sig: Take 1 tablet (4 mg total) by mouth every 8 (eight) hours as needed for nausea or vomiting.    Dispense:  20 tablet    Refill:  0   dicyclomine (BENTYL) 20 MG tablet    Sig: Take 1 tablet (20 mg total) by mouth every 6 (six) hours.    Dispense:  30 tablet    Refill:  0   Referral Orders  No referral(s) requested today     Note is dictated utilizing voice recognition software. Although note has been proof read prior to signing, occasional typographical errors still can be missed. If any questions arise, please do not hesitate to call for verification.   electronically signed by:  Felix Pacini, DO  [facility] [place]

## 2022-08-02 NOTE — Patient Instructions (Signed)
Food Choices to Help Relieve Diarrhea, Adult Diarrhea can make you feel weak and cause you to become dehydrated. It is important to choose the right foods and drinks to: Relieve diarrhea. Replace lost fluids and nutrients. Prevent dehydration. What are tips for following this plan? Relieving diarrhea Avoid foods that make your diarrhea worse. These may include: Foods and beverages sweetened with high-fructose corn syrup, honey, or sweeteners such as xylitol, sorbitol, and mannitol. Fried, greasy, or spicy foods. Raw fruits and vegetables. Eat foods that are rich in probiotics. These include foods such as yogurt and fermented milk products. Probiotics can help increase healthy bacteria in your stomach and intestines (gastrointestinal tract or GI tract). This may help digestion and stop diarrhea. If you have lactose intolerance, avoid dairy products. These may make your diarrhea worse. Take medicine to help stop diarrhea only as told by your health care provider. Replacing nutrients  Eat bland, easy-to-digest foods in small amounts as you are able, until your diarrhea starts to get better. These foods include bananas, applesauce, rice, toast, and crackers. Gradually reintroduce nutrient-rich foods as tolerated or as told by your health care provider. This includes: Well-cooked protein foods, such as eggs, lean meats like fish or chicken without skin, and tofu. Peeled, seeded, and soft-cooked fruits and vegetables. Low-fat dairy products. Whole grains. Take vitamin and mineral supplements as told by your health care provider. Preventing dehydration  Start by sipping water or a solution to prevent dehydration (oral rehydration solution, ORS). This is a drink that helps replace fluids and minerals your body has lost. You can buy an ORS at pharmacies and retail stores. Try to drink at least 8-10 cups (2,000-2,500 mL) of fluid each day to help replace lost fluids. If you have urine that is pale  yellow, you are getting enough fluids. You may drink other liquids in addition to water, such as fruit juice that you have added water to (diluted fruit juice) or low-calorie sports drinks, as tolerated or as told by your health care provider. Avoid drinks with caffeine, such as coffee, tea, or soft drinks. Avoid alcohol. Summary When you have diarrhea, it is important to choose the right foods and drinks to relieve diarrhea, to replace lost fluids and nutrients, and to prevent dehydration. Make sure you drink enough fluid to keep your urine pale yellow. You may benefit from eating bland foods at first. Gradually reintroduce healthy, nutrient-rich foods as tolerated or as told by your health care provider. Avoid foods that make your diarrhea worse, such as fried, greasy, or spicy foods. This information is not intended to replace advice given to you by your health care provider. Make sure you discuss any questions you have with your health care provider. Document Revised: 01/04/2022 Document Reviewed: 11/30/2019 Elsevier Patient Education  2023 Elsevier Inc.  

## 2022-08-07 ENCOUNTER — Encounter: Payer: Self-pay | Admitting: Family Medicine

## 2022-08-09 ENCOUNTER — Ambulatory Visit: Payer: BC Managed Care – PPO | Admitting: Family Medicine

## 2022-08-09 ENCOUNTER — Encounter: Payer: Self-pay | Admitting: Family Medicine

## 2022-08-09 VITALS — BP 122/80 | HR 78 | Temp 97.8°F | Wt 132.2 lb

## 2022-08-09 DIAGNOSIS — R1011 Right upper quadrant pain: Secondary | ICD-10-CM | POA: Diagnosis not present

## 2022-08-09 DIAGNOSIS — R109 Unspecified abdominal pain: Secondary | ICD-10-CM

## 2022-08-09 DIAGNOSIS — R7989 Other specified abnormal findings of blood chemistry: Secondary | ICD-10-CM | POA: Diagnosis not present

## 2022-08-09 LAB — POCT URINALYSIS DIPSTICK
Bilirubin, UA: NEGATIVE
Blood, UA: NEGATIVE
Glucose, UA: NEGATIVE
Ketones, UA: NEGATIVE
Leukocytes, UA: NEGATIVE
Nitrite, UA: NEGATIVE
Protein, UA: POSITIVE — AB
Spec Grav, UA: 1.025 (ref 1.010–1.025)
Urobilinogen, UA: 0.2 E.U./dL
pH, UA: 6 (ref 5.0–8.0)

## 2022-08-09 LAB — POCT URINE PREGNANCY: Preg Test, Ur: NEGATIVE

## 2022-08-09 NOTE — Telephone Encounter (Signed)
Pt scheduled at [place] office @3  PM

## 2022-08-09 NOTE — Patient Instructions (Addendum)
We are ordering labs and an ultrasound.  Continue to hydrate. Avoid greasy or fatty foods.

## 2022-08-09 NOTE — Progress Notes (Signed)
Assessment/Plan:   Problem List Items Addressed This Visit   None Visit Diagnoses     RUQ abdominal pain    -  Primary   Relevant Orders   POCT Urinalysis Dipstick (Completed)   POCT urine pregnancy (Completed)   Comp Met (CMET)   Lipase   CBC with Differential/Platelet   US ABDOMEN LIMITED RUQ (LIVER/GB)   Abdominal pain, unspecified abdominal location       Relevant Orders   POCT Urinalysis Dipstick (Completed)   Elevated LFTs       Relevant Orders   POCT Urinalysis Dipstick (Completed)   POCT urine pregnancy (Completed)   Comp Met (CMET)   Lipase   CBC with Differential/Platelet   US ABDOMEN LIMITED RUQ (LIVER/GB)      Right upper quadrant pain Overall improving Patient stable and tolerating p.o. Worsened by eating Possibly improving viral gastroenteritis, Cannot rule out hepatic, biliary, or pancreatic dysfunction Return precautions discussed     Subjective:  HPI:  Patricia Willis is a 21 y.o. female who has Chronic headaches; Generalized anxiety disorder; Atopic dermatitis; and Marijuana use on their problem list..   She  has a past medical history of Allergic rhinitis, Anxiety with somatization, Atopic dermatitis, Constipation, Depression, Headache, and Sleep disturbances..   She presents with chief complaint of Abdominal Pain (Right upper abdominal pain ) .   Abdominal Pain  She reports new onset abdominal pain.  Pain started approximately 1 week ago.  On 08/01/2022, patient was evaluated by PCP, Kuneff, Renee A, DO , for nasal congestion, URI symptoms, and abdominal pain.  Patient had negative point-of-care flu and COVID testing.  Patient had benign abdominal exam that day.  Diagnosed with viral-like syndrome with gastroenteritis.  Patient reports that the abdominal pain is staying constant. The abdominal pain is located in the right upper quadrant with radiation to back, chest, and shoulder . It is described as aching and sharp, is moderate in intensity,  occurring  2-3 times a day.  Episodes last about 1 minute.  The pain is not getting more painful, longer, more frequent. It is aggravated by eating anything  and is relieved by nothing. She has tried NSAIDs and bentyl  with little relief. No SOB.  Ondansetron has helped with nausea vomiting.  Associated symptoms: No anorexia  No belching  No bloody stool No blood in urine   No constipation Yes diarrhea  No dysuria No fever  No flatus Yes headaches  Yes headaches No joint pains  No myalgias Yes nausea, improved  Yes vomiting, last episode 2 days ago No weight loss     Recent GI studies: None On 06/02/2022, patient have episode of COVID.  Of note patient did have mild elevated LFTs AST 138, ALT 92.  On 06/07/2022 patient repeat LFTs that did show improvement with AST in normal range at 30, and mildly elevated ALT at 87.  Previous labs Lab Results  Component Value Date   WBC 4.7 06/07/2022   HGB 16.8 (H) 06/07/2022   HCT 47.6 (H) 06/07/2022   MCV 92.6 06/07/2022   MCH 32.7 06/07/2022   RDW 11.7 06/07/2022   PLT 231 06/07/2022   Lab Results  Component Value Date   GLUCOSE 83 06/07/2022   NA 139 06/07/2022   K 4.9 06/07/2022   CL 98 06/07/2022   CO2 26 06/07/2022   BUN 16 06/07/2022   CREATININE 1.01 (H) 06/07/2022   GFRNONAA >60 06/02/2022   CALCIUM 10.3 (H) 06/07/2022   PROT  7.7 06/07/2022   ALBUMIN 4.4 06/02/2022   BILITOT 0.8 06/07/2022   ALKPHOS 53 06/02/2022   AST 30 06/07/2022   ALT 87 (H) 06/07/2022   ANIONGAP 7 06/02/2022   No results found for: "AMYLASE" -----------------------------------------------------------------------------------------   Past Surgical History:  Procedure Laterality Date   TONSILLECTOMY      Outpatient Medications Prior to Visit  Medication Sig Dispense Refill   magnesium oxide (MAG-OX) 400 MG tablet Take 400 mg by mouth.     norgestrel-ethinyl estradiol (LO/OVRAL) 0.3-30 MG-MCG tablet TAKE 1 TABLET BY MOUTH EVERY DAY      ondansetron (ZOFRAN) 4 MG tablet Take 1 tablet (4 mg total) by mouth every 8 (eight) hours as needed for nausea or vomiting. 20 tablet 0   sertraline (ZOLOFT) 100 MG tablet Take 1 tablet (100 mg total) by mouth daily. (Patient taking differently: Take 100 mg by mouth daily. Patient reports 150 mg) 90 tablet 1   triamcinolone cream (KENALOG) 0.1 % Apply 1 Application topically 2 (two) times daily. 30 g 5   dicyclomine (BENTYL) 20 MG tablet Take 1 tablet (20 mg total) by mouth every 6 (six) hours. (Patient not taking: Reported on 08/09/2022) 30 tablet 0   No facility-administered medications prior to visit.    Family History  Problem Relation Age of Onset   Hypertension Maternal Grandmother     Social History   Socioeconomic History   Marital status: Single    Spouse name: Not on file   Number of children: 0   Years of education: 10   Highest education level: 12th grade  Occupational History   Occupation: high Retail buyer  Tobacco Use   Smoking status: Never   Smokeless tobacco: Never  Vaping Use   Vaping Use: Every day  Substance and Sexual Activity   Alcohol use: No   Drug use: Yes    Types: Marijuana    Comment: daily use   Sexual activity: Yes    Birth control/protection: Condom, Pill    Comment: not asked with mother in the room  Other Topics Concern   Not on file  Social History Narrative   Lives with mother only (has older brother that is an adult).   Wears her seatbelt, wears sunscreen.    Smoke detector in the home.    Feels safe in her relationships.    Social Determinants of Health   Financial Resource Strain: Low Risk  (01/28/2022)   Overall Financial Resource Strain (CARDIA)    Difficulty of Paying Living Expenses: Not very hard  Food Insecurity: No Food Insecurity (01/28/2022)   Hunger Vital Sign    Worried About Running Out of Food in the Last Year: Never true    Ran Out of Food in the Last Year: Never true  Transportation Needs: No Transportation  Needs (01/28/2022)   PRAPARE - Hydrologist (Medical): No    Lack of Transportation (Non-Medical): No  Physical Activity: Sufficiently Active (01/28/2022)   Exercise Vital Sign    Days of Exercise per Week: 3 days    Minutes of Exercise per Session: 60 min  Stress: No Stress Concern Present (01/28/2022)   [place]    Feeling of Stress : Only a little  Social Connections: Socially Isolated (01/28/2022)   Social Connection and Isolation Panel [NHANES]    Frequency of Communication with Friends and Family: Never    Frequency of Social Gatherings with Friends and Family:  Once a week    Attends Religious Services: Never    Active Member of Clubs or Organizations: No    Attends Music therapist: Not on file    Marital Status: Living with partner  Intimate Partner Violence: Not on file                                                                                                 Objective:  Physical Exam: BP 122/80 (BP Location: Left Arm, Patient Position: Sitting, Cuff Size: Large)   Pulse 78   Temp 97.8 F (36.6 C) (Temporal)   Wt 132 lb 3.2 oz (60 kg)   LMP 07/30/2022   SpO2 98%   BMI 22.00 kg/m    General: No acute distress. Awake and conversant.  Eyes: Normal conjunctiva, anicteric. Round symmetric pupils.  ENT: Hearing grossly intact. No nasal discharge.  Neck: Neck is supple. No masses or thyromegaly.  Respiratory: Respirations are non-labored. No auditory wheezing.  CTA B Skin: Warm. No rashes or ulcers.  Psych: Alert and oriented. Cooperative, Appropriate mood and affect, Normal judgment.  CV: No cyanosis or JVD, RRR, no MRG ABD: Right upper quadrant mildly tender, without rebound or guarding MSK: Normal ambulation. No clubbing  Neuro: Sensation and CN II-XII grossly normal.        Alesia Banda, MD, MS

## 2022-08-10 LAB — CBC WITH DIFFERENTIAL/PLATELET
Basophils Absolute: 0 10*3/uL (ref 0.0–0.2)
Basos: 1 %
EOS (ABSOLUTE): 0 10*3/uL (ref 0.0–0.4)
Eos: 0 %
Hematocrit: 42.5 % (ref 34.0–46.6)
Hemoglobin: 14.6 g/dL (ref 11.1–15.9)
Immature Grans (Abs): 0 10*3/uL (ref 0.0–0.1)
Immature Granulocytes: 0 %
Lymphocytes Absolute: 1.7 10*3/uL (ref 0.7–3.1)
Lymphs: 27 %
MCH: 32.4 pg (ref 26.6–33.0)
MCHC: 34.4 g/dL (ref 31.5–35.7)
MCV: 94 fL (ref 79–97)
Monocytes Absolute: 0.4 10*3/uL (ref 0.1–0.9)
Monocytes: 7 %
Neutrophils Absolute: 4 10*3/uL (ref 1.4–7.0)
Neutrophils: 65 %
Platelets: 273 10*3/uL (ref 150–450)
RBC: 4.5 x10E6/uL (ref 3.77–5.28)
RDW: 11.7 % (ref 11.7–15.4)
WBC: 6.1 10*3/uL (ref 3.4–10.8)

## 2022-08-10 LAB — COMPREHENSIVE METABOLIC PANEL
ALT: 19 IU/L (ref 0–32)
AST: 21 IU/L (ref 0–40)
Albumin/Globulin Ratio: 2.1 (ref 1.2–2.2)
Albumin: 5.1 g/dL — ABNORMAL HIGH (ref 4.0–5.0)
Alkaline Phosphatase: 71 IU/L (ref 44–121)
BUN/Creatinine Ratio: 11 (ref 9–23)
BUN: 11 mg/dL (ref 6–20)
Bilirubin Total: 0.3 mg/dL (ref 0.0–1.2)
CO2: 22 mmol/L (ref 20–29)
Calcium: 9.9 mg/dL (ref 8.7–10.2)
Chloride: 101 mmol/L (ref 96–106)
Creatinine, Ser: 0.99 mg/dL (ref 0.57–1.00)
Globulin, Total: 2.4 g/dL (ref 1.5–4.5)
Glucose: 98 mg/dL (ref 70–99)
Potassium: 3.8 mmol/L (ref 3.5–5.2)
Sodium: 140 mmol/L (ref 134–144)
Total Protein: 7.5 g/dL (ref 6.0–8.5)
eGFR: 82 mL/min/{1.73_m2} (ref >59)

## 2022-08-10 LAB — LIPASE: Lipase: 28 U/L (ref 14–72)

## 2022-08-14 ENCOUNTER — Telehealth: Payer: Self-pay | Admitting: Family Medicine

## 2022-08-14 ENCOUNTER — Ambulatory Visit (HOSPITAL_BASED_OUTPATIENT_CLINIC_OR_DEPARTMENT_OTHER)
Admission: RE | Admit: 2022-08-14 | Discharge: 2022-08-14 | Disposition: A | Discharge status: Home / Self Care | Payer: BC Managed Care – PPO | Source: Ambulatory Visit | Attending: Family Medicine | Admitting: Family Medicine

## 2022-08-14 DIAGNOSIS — R1011 Right upper quadrant pain: Secondary | ICD-10-CM | POA: Insufficient documentation

## 2022-08-14 DIAGNOSIS — R7989 Other specified abnormal findings of blood chemistry: Secondary | ICD-10-CM | POA: Insufficient documentation

## 2022-08-14 NOTE — Telephone Encounter (Signed)
Tried calling the number below, but it wasn't active. Contacted patient and provided her with [facility] number [phone] for scheduling.

## 2022-08-14 NOTE — Telephone Encounter (Signed)
Pt mom called and stated she never heard anything from where you referred the pt to for abdominal . Please call pt mom ( [email or web address])@ [phone]

## 2022-10-03 ENCOUNTER — Encounter: Payer: Self-pay | Admitting: Family Medicine

## 2022-10-15 ENCOUNTER — Other Ambulatory Visit: Payer: Self-pay | Admitting: Family Medicine

## 2022-10-15 DIAGNOSIS — F411 Generalized anxiety disorder: Secondary | ICD-10-CM

## 2022-10-29 ENCOUNTER — Ambulatory Visit: Payer: BC Managed Care – PPO | Admitting: Family Medicine

## 2022-10-30 ENCOUNTER — Ambulatory Visit: Payer: BC Managed Care – PPO | Admitting: Family Medicine

## 2022-10-30 ENCOUNTER — Encounter: Payer: Self-pay | Admitting: Family Medicine

## 2022-10-30 VITALS — BP 132/83 | HR 83 | Temp 98.7°F | Wt 136.2 lb

## 2022-10-30 DIAGNOSIS — F411 Generalized anxiety disorder: Secondary | ICD-10-CM

## 2022-10-30 DIAGNOSIS — K219 Gastro-esophageal reflux disease without esophagitis: Secondary | ICD-10-CM | POA: Diagnosis not present

## 2022-10-30 DIAGNOSIS — R1013 Epigastric pain: Secondary | ICD-10-CM | POA: Diagnosis not present

## 2022-10-30 MED ORDER — OMEPRAZOLE 40 MG PO CPDR: 40.0000 mg | DELAYED_RELEASE_CAPSULE | Freq: Every day | ORAL | 2 refills | Status: DC

## 2022-10-30 MED ORDER — SERTRALINE HCL 100 MG PO TABS: 150.0000 mg | ORAL_TABLET | Freq: Every day | ORAL | 1 refills | Status: DC

## 2022-10-30 NOTE — Patient Instructions (Signed)
No follow-ups on file.        Great to see you today.  I have refilled the medication(s) we provide.   If labs were collected, we will inform you of lab results once received either by echart message or telephone call.   - echart message- for normal results that have been seen by the patient already.   - telephone call: abnormal results or if patient has not viewed results in their echart.  

## 2022-10-30 NOTE — Progress Notes (Unsigned)
Patient Care Team    Relationship Specialty Notifications Start End  Ma Hillock, DO PCP - General Family Medicine  06/01/15   Modesto Charon, PA-C Physician Assistant Obstetrics and Gynecology  03/07/20     SUBJECTIVE Chief Complaint  Patient presents with   Abdominal Pain    C/o abd pain starting Sunday with headaches    HPI: Saray Capasso is a 22 y.o. female present for  Anxiety: *** Shalondra reports she is feeling well on zoloft 100 mg qd. 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 She was able to by herself a new car ([employer] 2021). Feels happy and has been able to decrease the Zoloft from 200 to 100 mg over the last few months and feels she is still doing well. Prior note:  Presents for follow-up on her anxiety today. She states that she has been taking the 150 mg of Zoloft daily. She denies any negative side effects from the medication. She reports increased anxiety currently secondary to a relationship. She has been dating a young man for approximately one year, and she is having problems in that relationship. She has also graduated early this year in December, but will not walk with her class until June. She currently does not have a job and is deciding if she wants to attend college. She states she is not a "school type "of person and really does not want to attend college. She also states she would like to be involved in exercise science/physiology.  Stomach pain: ***     06/07/2022    2:39 PM 04/16/2022    2:27 PM 01/28/2022    3:50 PM 09/06/2020    2:29 PM 09/06/2020    2:18 PM  Depression screen PHQ 2/9  Decreased Interest 3 1 0 0 0  Down, Depressed, Hopeless 3 1 0 1 0  PHQ - 2 Score 6 2 0 1 0  Altered sleeping 2 0  0   Tired, decreased energy 3 1  1    Change in appetite 3 1  0   Feeling bad or failure about yourself  2 1  0   Trouble concentrating 2 0  1   Moving slowly or fidgety/restless 1 1  0   Suicidal thoughts 2 0  0   PHQ-9  Score 21 6  3          06/07/2022    2:39 PM 04/16/2022    2:27 PM 09/06/2020    2:29 PM 03/07/2020    9:20 AM  GAD 7 : Generalized Anxiety Score  Nervous, Anxious, on Edge 3 1 2 1   Control/stop worrying 3 1 1  0  Worry too much - different things 2 1 1  0  Trouble relaxing 2 0 0 0  Restless 1 0 0 0  Easily annoyed or irritable 0 2 1 2   Afraid - awful might happen 1 0 1 0  Total GAD 7 Score 12 5 6 3   Anxiety Difficulty    Somewhat difficult    ROS: See pertinent positives and negatives per HPI.    Patient Active Problem List   Diagnosis Date Noted   Marijuana use 02/04/2018   Atopic dermatitis 07/24/2015   Chronic headaches 06/01/2015   Generalized anxiety disorder 06/01/2015    Social History   Tobacco Use   Smoking status: Never   Smokeless tobacco: Never  Substance Use Topics   Alcohol use: No    Current Outpatient Medications:  magnesium oxide (MAG-OX) 400 MG tablet, Take 400 mg by mouth., Disp: , Rfl:    norgestrel-ethinyl estradiol (LO/OVRAL) 0.3-30 MG-MCG tablet, TAKE 1 TABLET BY MOUTH EVERY DAY, Disp: , Rfl:    ondansetron (ZOFRAN) 4 MG tablet, Take 1 tablet (4 mg total) by mouth every 8 (eight) hours as needed for nausea or vomiting., Disp: 20 tablet, Rfl: 0   sertraline (ZOLOFT) 100 MG tablet, TAKE 1 TABLET BY MOUTH EVERY DAY, Disp: 30 tablet, Rfl: 0   triamcinolone cream (KENALOG) 0.1 %, Apply 1 Application topically 2 (two) times daily., Disp: 30 g, Rfl: 5   dicyclomine (BENTYL) 20 MG tablet, Take 1 tablet (20 mg total) by mouth every 6 (six) hours. (Patient not taking: Reported on 08/09/2022), Disp: 30 tablet, Rfl: 0  Allergies  Allergen Reactions   Keflex [Cephalexin]     OBJECTIVE: *** BP 132/83   Pulse 83   Temp 98.7 F (37.1 C)   Wt 136 lb 3.2 oz (61.8 kg)   SpO2 96%   BMI 22.66 kg/m  Physical Exam Vitals and nursing note reviewed.  Constitutional:      General: She is not in acute distress.    Appearance: Normal appearance. She is  normal weight. She is not ill-appearing or toxic-appearing.  Eyes:     Extraocular Movements: Extraocular movements intact.     Conjunctiva/sclera: Conjunctivae normal.     Pupils: Pupils are equal, round, and reactive to light.  Neurological:     Mental Status: She is alert and oriented to person, place, and time. Mental status is at baseline.  Psychiatric:        Mood and Affect: Mood normal.        Behavior: Behavior normal.        Thought Content: Thought content normal.        Judgment: Judgment normal.     ASSESSMENT AND PLAN: Jelena Malicoat is a 22 y.o. female present for  Generalized anxiety disorder Stable Continue  Zoloft 100 milligrams daily - Follow-up on 5.5 months on anxiety as long as doing well, sooner if needed.   Atopic dermatitis: Discussed rash is likely caused by the chemical irritant she is being exposed to at work.  Wearing gloves may be beneficial.  She feels it is worse after giving the dogs a bath. Kenalog cream twice daily prescribed  Abd pain:  No orders of the defined types were placed in this encounter.  No orders of the defined types were placed in this encounter.  Referral Orders  No referral(s) requested today     Howard Pouch, DO 10/30/2022

## 2022-11-15 ENCOUNTER — Other Ambulatory Visit: Payer: Self-pay | Admitting: Family Medicine

## 2022-11-15 DIAGNOSIS — F411 Generalized anxiety disorder: Secondary | ICD-10-CM

## 2022-12-09 DIAGNOSIS — Z01419 Encounter for gynecological examination (general) (routine) without abnormal findings: Secondary | ICD-10-CM | POA: Diagnosis not present

## 2023-01-23 ENCOUNTER — Other Ambulatory Visit: Payer: Self-pay | Admitting: Family Medicine

## 2023-01-23 DIAGNOSIS — K219 Gastro-esophageal reflux disease without esophagitis: Secondary | ICD-10-CM

## 2023-01-23 DIAGNOSIS — R1013 Epigastric pain: Secondary | ICD-10-CM

## 2023-04-02 ENCOUNTER — Encounter: Payer: Self-pay | Admitting: Family Medicine

## 2023-04-03 DIAGNOSIS — L247 Irritant contact dermatitis due to plants, except food: Secondary | ICD-10-CM | POA: Diagnosis not present

## 2023-04-03 DIAGNOSIS — R030 Elevated blood-pressure reading, without diagnosis of hypertension: Secondary | ICD-10-CM | POA: Diagnosis not present

## 2023-04-03 DIAGNOSIS — Z6823 Body mass index (BMI) 23.0-23.9, adult: Secondary | ICD-10-CM | POA: Diagnosis not present

## 2023-04-19 ENCOUNTER — Other Ambulatory Visit: Payer: Self-pay | Admitting: Family Medicine

## 2023-04-19 DIAGNOSIS — K219 Gastro-esophageal reflux disease without esophagitis: Secondary | ICD-10-CM

## 2023-04-19 DIAGNOSIS — R1013 Epigastric pain: Secondary | ICD-10-CM

## 2023-05-15 ENCOUNTER — Other Ambulatory Visit: Payer: Self-pay

## 2023-05-16 ENCOUNTER — Ambulatory Visit: Payer: BC Managed Care – PPO | Admitting: Family Medicine

## 2023-05-16 ENCOUNTER — Encounter: Payer: Self-pay | Admitting: Family Medicine

## 2023-05-16 VITALS — BP 130/70 | HR 67 | Temp 98.4°F | Wt 147.8 lb

## 2023-05-16 DIAGNOSIS — R61 Generalized hyperhidrosis: Secondary | ICD-10-CM

## 2023-05-16 DIAGNOSIS — R1013 Epigastric pain: Secondary | ICD-10-CM

## 2023-05-16 DIAGNOSIS — F411 Generalized anxiety disorder: Secondary | ICD-10-CM

## 2023-05-16 DIAGNOSIS — K219 Gastro-esophageal reflux disease without esophagitis: Secondary | ICD-10-CM | POA: Diagnosis not present

## 2023-05-16 MED ORDER — SERTRALINE HCL 100 MG PO TABS: 150.0000 mg | ORAL_TABLET | Freq: Every day | ORAL | 1 refills | Status: DC

## 2023-05-16 NOTE — Progress Notes (Signed)
Patient Care Team    Relationship Specialty Notifications Start End  Natalia Leatherwood, DO PCP - General Family Medicine  06/01/15   Ardyth Gal, PA-C Physician Assistant Obstetrics and Gynecology  03/07/20     SUBJECTIVE Chief Complaint  Patient presents with   Medication Refill    Pt needs refill on Sertraline. She is no longer taking Omeprazole, last dose was 7/7. She states she has been experiencing bad night sweats.    HPI: Patricia Willis is a 22 y.o. female present for  Anxiety:  Dene reports she is feeling okay zoloft 100 mg qd, but does admit she is having a little more anxiety.  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 She was able to by herself a new car ([employer] 2021).  Patient is interested in increasing her dose of Zoloft Prior note:  Presents for follow-up on her anxiety today. She states that she has been taking the 150 mg of Zoloft daily. She denies any negative side effects from the medication. She reports increased anxiety currently secondary to a relationship. She has been dating a young man for approximately one year, and she is having problems in that relationship. She has also graduated early this year in December, but will not walk with her class until June. She currently does not have a job and is deciding if she wants to attend college. She states she is not a "school type "of person and really does not want to attend college. She also states she would like to be involved in exercise science/physiology.  Night sweats: Pt reports she has noticed night sweats last 6 months. She is waking drenched in sweat. No associated symptoms. Denies fatigue or unintentional weight loss.      05/16/2023    8:58 AM 10/30/2022    4:30 PM 06/07/2022    2:39 PM 04/16/2022    2:27 PM 01/28/2022    3:50 PM  Depression screen PHQ 2/9   Decreased Interest 0 1 3 1  0  Down, Depressed, Hopeless 0 0 3 1 0  PHQ - 2 Score 0 1 6 2  0  Altered sleeping 1  2 0   Tired, decreased energy 1  3 1    Change in appetite 1  3 1    Feeling bad or failure about yourself  0  2 1   Trouble concentrating 0  2 0   Moving slowly or fidgety/restless 0  1 1   Suicidal thoughts 0  2 0   PHQ-9 Score 3  21 6    Difficult doing work/chores Not difficult at all            05/16/2023    8:58 AM 10/30/2022    4:30 PM 06/07/2022    2:39 PM 04/16/2022    2:27 PM  GAD 7 : Generalized Anxiety Score  Nervous, Anxious, on Edge 1 2 3 1   Control/stop worrying 0 2 3 1   Worry too much - different things 0 1 2 1   Trouble relaxing 0 1 2 0  Restless 0 1 1 0  Easily annoyed or irritable 1 2 0 2  Afraid - awful might happen 0 0 1 0  Total GAD 7 Score 2 9 12 5   Anxiety Difficulty Not difficult at all       ROS: See pertinent positives and negatives  per HPI.    Patient Active Problem List   Diagnosis Date Noted   Marijuana use 02/04/2018   Atopic dermatitis 07/24/2015   Chronic headaches 06/01/2015   Generalized anxiety disorder 06/01/2015    Social History   Tobacco Use   Smoking status: Never   Smokeless tobacco: Never  Substance Use Topics   Alcohol use: No    Current Outpatient Medications:    norgestrel-ethinyl estradiol (LO/OVRAL) 0.3-30 MG-MCG tablet, TAKE 1 TABLET BY MOUTH EVERY DAY, Disp: , Rfl:    triamcinolone cream (KENALOG) 0.1 %, Apply 1 Application topically 2 (two) times daily., Disp: 30 g, Rfl: 5   omeprazole (PRILOSEC) 40 MG capsule, TAKE 1 CAPSULE (40 MG TOTAL) BY MOUTH DAILY. (Patient not taking: Reported on 05/16/2023), Disp: 90 capsule, Rfl: 0   sertraline (ZOLOFT) 100 MG tablet, Take 1.5 tablets (150 mg total) by mouth daily., Disp: 135 tablet, Rfl: 1  Allergies  Allergen Reactions   Keflex [Cephalexin]     OBJECTIVE:  BP 130/70   Pulse 67   Temp 98.4 F (36.9 C) (Oral)   Wt 147 lb 12.8 oz (67 kg)   SpO2 99%    BMI 24.60 kg/m  Physical Exam Vitals and nursing note reviewed.  Constitutional:      General: She is not in acute distress.    Appearance: Normal appearance. She is normal weight. She is not ill-appearing or toxic-appearing.  HENT:     Head: Normocephalic and atraumatic.     Mouth/Throat:     Mouth: Mucous membranes are moist.  Eyes:     Extraocular Movements: Extraocular movements intact.     Conjunctiva/sclera: Conjunctivae normal.     Pupils: Pupils are equal, round, and reactive to light.  Neck:     Comments: No thyromegaly Cardiovascular:     Rate and Rhythm: Normal rate and regular rhythm.     Heart sounds: No murmur heard. Pulmonary:     Effort: Pulmonary effort is normal. No respiratory distress.     Breath sounds: Normal breath sounds. No wheezing, rhonchi or rales.  Musculoskeletal:     Cervical back: Neck supple. No tenderness.     Right lower leg: No edema.     Left lower leg: No edema.  Skin:    Findings: No rash.  Neurological:     Mental Status: She is alert and oriented to person, place, and time. Mental status is at baseline.     Comments: Mildly anxious  Psychiatric:        Mood and Affect: Mood normal.        Behavior: Behavior normal.        Thought Content: Thought content normal.        Judgment: Judgment normal.     ASSESSMENT AND PLAN: Patricia Willis is a 22 y.o. female present for  Generalized anxiety disorder stable Continue Zoloft to 150 mg daily.   - Follow-up on 5.5 months  Night sweats: May be related to increase in zoloft.  Will rule out anemia, iron d/o, thyroid d/o or liver/lyte d/o Cbc, cmp, tsh, iron panel  Orders Placed This Encounter  Procedures   CBC w/Diff   Iron, TIBC and Ferritin Panel   TSH   Comp Met (CMET)   Meds ordered this encounter  Medications   sertraline (ZOLOFT) 100 MG tablet    Sig: Take 1.5 tablets (150 mg total) by mouth daily.    Dispense:  135 tablet    Refill:  1  Referral Orders  No  referral(s) requested today     Felix Pacini, DO 05/16/2023

## 2023-05-16 NOTE — Patient Instructions (Addendum)
 Return in about 6 months (around 11/03/2023) for cpe (20 min), Routine chronic condition follow-up.        Great to see you today.  I have refilled the medication(s) we provide.   If labs were collected, we will inform you of lab results once received either by echart message or telephone call.   - echart message- for normal results that have been seen by the patient already.   - telephone call: abnormal results or if patient has not viewed results in their echart.

## 2023-05-17 LAB — COMPREHENSIVE METABOLIC PANEL
AG Ratio: 1.8 (calc) (ref 1.0–2.5)
ALT: 15 U/L (ref 6–29)
AST: 17 U/L (ref 10–30)
Albumin: 4.3 g/dL (ref 3.6–5.1)
Alkaline phosphatase (APISO): 68 U/L (ref 31–125)
BUN: 10 mg/dL (ref 7–25)
CO2: 24 mmol/L (ref 20–32)
Calcium: 9.5 mg/dL (ref 8.6–10.2)
Chloride: 104 mmol/L (ref 98–110)
Creat: 0.94 mg/dL (ref 0.50–0.96)
Globulin: 2.4 g/dL (calc) (ref 1.9–3.7)
Glucose, Bld: 93 mg/dL (ref 65–99)
Potassium: 4.6 mmol/L (ref 3.5–5.3)
Sodium: 139 mmol/L (ref 135–146)
Total Bilirubin: 0.6 mg/dL (ref 0.2–1.2)
Total Protein: 6.7 g/dL (ref 6.1–8.1)

## 2023-05-17 LAB — CBC WITH DIFFERENTIAL/PLATELET
Absolute Monocytes: 321 cells/uL (ref 200–950)
Basophils Absolute: 31 cells/uL (ref 0–200)
Basophils Relative: 0.7 %
Eosinophils Absolute: 70 cells/uL (ref 15–500)
Eosinophils Relative: 1.6 %
HCT: 41.2 % (ref 35.0–45.0)
Hemoglobin: 13.9 g/dL (ref 11.7–15.5)
Lymphs Abs: 1377 cells/uL (ref 850–3900)
MCH: 31.9 pg (ref 27.0–33.0)
MCHC: 33.7 g/dL (ref 32.0–36.0)
MCV: 94.5 fL (ref 80.0–100.0)
MPV: 10.7 fL (ref 7.5–12.5)
Monocytes Relative: 7.3 %
Neutro Abs: 2600 cells/uL (ref 1500–7800)
Neutrophils Relative %: 59.1 %
Platelets: 220 10*3/uL (ref 140–400)
RBC: 4.36 10*6/uL (ref 3.80–5.10)
RDW: 11.8 % (ref 11.0–15.0)
Total Lymphocyte: 31.3 %
WBC: 4.4 10*3/uL (ref 3.8–10.8)

## 2023-05-17 LAB — IRON,TIBC AND FERRITIN PANEL
%SAT: 32 % (calc) (ref 16–45)
Ferritin: 46 ng/mL (ref 16–154)
Iron: 124 ug/dL (ref 40–190)
TIBC: 388 mcg/dL (calc) (ref 250–450)

## 2023-05-17 LAB — TSH: TSH: 1.2 mIU/L

## 2023-10-15 ENCOUNTER — Telehealth: Payer: BC Managed Care – PPO | Admitting: Family Medicine

## 2023-10-15 DIAGNOSIS — B9689 Other specified bacterial agents as the cause of diseases classified elsewhere: Secondary | ICD-10-CM | POA: Diagnosis not present

## 2023-10-15 DIAGNOSIS — J019 Acute sinusitis, unspecified: Secondary | ICD-10-CM | POA: Diagnosis not present

## 2023-10-15 MED ORDER — AZITHROMYCIN 250 MG PO TABS: ORAL_TABLET | ORAL | 0 refills | Status: DC

## 2023-10-15 NOTE — Progress Notes (Signed)
E-Visit for Sinus Problems  We are sorry that you are not feeling well.  Here is how we plan to help!  Based on what you have shared with me it looks like you have sinusitis.  Sinusitis is inflammation and infection in the sinus cavities of the head.  Based on your presentation I believe you most likely have Acute Bacterial Sinusitis.  This is an infection caused by bacteria and is treated with antibiotics. I have prescribed Zpack, take as directed. You may use an oral decongestant such as Mucinex D or if you have glaucoma or high blood pressure use plain Mucinex. Saline nasal spray help and can safely be used as often as needed for congestion.  If you develop worsening sinus pain, fever or notice severe headache and vision changes, or if symptoms are not better after completion of antibiotic, please schedule an appointment with a health care provider.    Sinus infections are not as easily transmitted as other respiratory infection, however we still recommend that you avoid close contact with loved ones, especially the very young and elderly.  Remember to wash your hands thoroughly throughout the day as this is the number one way to prevent the spread of infection!  Home Care: Only take medications as instructed by your medical team. Complete the entire course of an antibiotic. Do not take these medications with alcohol. A steam or ultrasonic humidifier can help congestion.  You can place a towel over your head and breathe in the steam from hot water coming from a faucet. Avoid close contacts especially the very young and the elderly. Cover your mouth when you cough or sneeze. Always remember to wash your hands.  Get Help Right Away If: You develop worsening fever or sinus pain. You develop a severe head ache or visual changes. Your symptoms persist after you have completed your treatment plan.  Make sure you Understand these instructions. Will watch your condition. Will get help right away  if you are not doing well or get worse.  Thank you for choosing an e-visit.  Your e-visit answers were reviewed by a board certified advanced clinical practitioner to complete your personal care plan. Depending upon the condition, your plan could have included both over the counter or prescription medications.  Please review your pharmacy choice. Make sure the pharmacy is open so you can pick up prescription now. If there is a problem, you may contact your provider through [employer] and have the prescription routed to another pharmacy.  Your safety is important to [place]. If you have drug allergies check your prescription carefully.   For the next 24 hours you can use MyChart to ask questions about today's visit, request a non-urgent call back, or ask for a work or school excuse. You will get an email in the next two days asking about your experience. I hope that your e-visit has been valuable and will speed your recovery.  I provided 5 minutes of non face-to-face time during this encounter for chart review, medication and order placement, as well as and documentation.

## 2023-11-18 ENCOUNTER — Other Ambulatory Visit: Payer: Self-pay | Admitting: Family Medicine

## 2023-11-18 DIAGNOSIS — F411 Generalized anxiety disorder: Secondary | ICD-10-CM

## 2023-11-21 ENCOUNTER — Telehealth: Payer: BC Managed Care – PPO | Admitting: Family Medicine

## 2023-11-21 DIAGNOSIS — J329 Chronic sinusitis, unspecified: Secondary | ICD-10-CM | POA: Diagnosis not present

## 2023-11-21 DIAGNOSIS — B9789 Other viral agents as the cause of diseases classified elsewhere: Secondary | ICD-10-CM | POA: Diagnosis not present

## 2023-11-21 MED ORDER — FLUTICASONE PROPIONATE 50 MCG/ACT NA SUSP: 2.0000 | Freq: Every day | NASAL | 6 refills | Status: DC

## 2023-11-21 NOTE — Progress Notes (Signed)
E-Visit for Sinus Problems  We are sorry that you are not feeling well.  Here is how we plan to help!  Based on what you have shared with me it looks like you have sinusitis.  Sinusitis is inflammation and infection in the sinus cavities of the head.  Based on your presentation I believe you most likely have Acute Viral Sinusitis.This is an infection most likely caused by a virus. There is not specific treatment for viral sinusitis other than to help you with the symptoms until the infection runs its course.  You may use an oral decongestant such as Mucinex D or if you have glaucoma or high blood pressure use plain Mucinex. Saline nasal spray help and can safely be used as often as needed for congestion, I have prescribed: Fluticasone nasal spray two sprays in each nostril once a day  Some authorities believe that zinc sprays or the use of Echinacea may shorten the course of your symptoms.  Sinus infections are not as easily transmitted as other respiratory infection, however we still recommend that you avoid close contact with loved ones, especially the very young and elderly.  Remember to wash your hands thoroughly throughout the day as this is the number one way to prevent the spread of infection!  Home Care: Only take medications as instructed by your medical team. Do not take these medications with alcohol. A steam or ultrasonic humidifier can help congestion.  You can place a towel over your head and breathe in the steam from hot water coming from a faucet. Avoid close contacts especially the very young and the elderly. Cover your mouth when you cough or sneeze. Always remember to wash your hands.  Get Help Right Away If: You develop worsening fever or sinus pain. You develop a severe head ache or visual changes. Your symptoms persist after you have completed your treatment plan.  Make sure you Understand these instructions. Will watch your condition. Will get help right away if you  are not doing well or get worse.   Thank you for choosing an e-visit.  Your e-visit answers were reviewed by a board certified advanced clinical practitioner to complete your personal care plan. Depending upon the condition, your plan could have included both over the counter or prescription medications.  Please review your pharmacy choice. Make sure the pharmacy is open so you can pick up prescription now. If there is a problem, you may contact your provider through [employer] and have the prescription routed to another pharmacy.  Your safety is important to [place]. If you have drug allergies check your prescription carefully.   For the next 24 hours you can use MyChart to ask questions about today's visit, request a non-urgent call back, or ask for a work or school excuse. You will get an email in the next two days asking about your experience. I hope that your e-visit has been valuable and will speed your recovery.   have provided 5 minutes of non face to face time during this encounter for chart review and documentation.

## 2023-12-11 ENCOUNTER — Other Ambulatory Visit: Payer: Self-pay | Admitting: Family Medicine

## 2023-12-11 DIAGNOSIS — F411 Generalized anxiety disorder: Secondary | ICD-10-CM

## 2023-12-14 ENCOUNTER — Other Ambulatory Visit: Payer: Self-pay | Admitting: Family Medicine

## 2023-12-14 DIAGNOSIS — F411 Generalized anxiety disorder: Secondary | ICD-10-CM

## 2024-02-06 ENCOUNTER — Other Ambulatory Visit: Payer: Self-pay

## 2024-02-06 ENCOUNTER — Ambulatory Visit: Admitting: Family Medicine

## 2024-02-06 DIAGNOSIS — F411 Generalized anxiety disorder: Secondary | ICD-10-CM

## 2024-02-06 MED ORDER — SERTRALINE HCL 100 MG PO TABS: 150.0000 mg | ORAL_TABLET | Freq: Every day | ORAL | 0 refills | Status: DC

## 2024-02-10 ENCOUNTER — Ambulatory Visit: Admitting: Family Medicine

## 2024-02-10 DIAGNOSIS — F411 Generalized anxiety disorder: Secondary | ICD-10-CM | POA: Diagnosis not present

## 2024-02-10 MED ORDER — SERTRALINE HCL 100 MG PO TABS: 150.0000 mg | ORAL_TABLET | Freq: Every day | ORAL | 1 refills | Status: DC

## 2024-02-10 NOTE — Patient Instructions (Signed)
 Return in about 25 weeks (around 08/03/2024) for Routine chronic condition follow-up.        Great to see you today.  I have refilled the medication(s) we provide.   If labs were collected or images ordered, we will inform you of  results once we have received them and reviewed. We will contact you either by echart message, or telephone call.  Please give ample time to the testing facility, and our office to run,  receive and review results. Please do not call inquiring of results, even if you can see them in your chart. We will contact you as soon as we are able. If it has been over 1 week since the test was completed, and you have not yet heard from us , then please call us .    - echart message- for normal results that have been seen by the patient already.   - telephone call: abnormal results or if patient has not viewed results in their echart.  If a referral to a specialist was entered for you, please call us  in 2 weeks if you have not heard from the specialist office to schedule.

## 2024-02-10 NOTE — Progress Notes (Signed)
 Patient Care Team    Relationship Specialty Notifications Start End  Natalia Leatherwood, DO PCP - General Family Medicine  06/01/15   Ardyth Gal, PA-C Physician Assistant Obstetrics and Gynecology  03/07/20     SUBJECTIVE Chief Complaint  Patient presents with   Anxiety    HPI: Patricia Willis is a 23 y.o. female present for  Anxiety:  Patricia Willis reports she is feeling ok zoloft 150 mg every day. Her job role has changed little and she is now working in her families shop.  She was able to by herself a new car ([employer] 2021).   Prior note:  Presents for follow-up on her anxiety today. She states that she has been taking the 150 mg of Zoloft daily. She denies any negative side effects from the medication. She reports increased anxiety currently secondary to a relationship. She has been dating a young man for approximately one year, and she is having problems in that relationship. She has also graduated early this year in December, but will not walk with her class until June. She currently does not have a job and is deciding if she wants to attend college. She states she is not a "school type "of person and really does not want to attend college. She also states she would like to be involved in exercise science/physiology.      02/10/2024   10:37 AM 05/16/2023    8:58 AM 10/30/2022    4:30 PM 06/07/2022    2:39 PM 04/16/2022    2:27 PM  Depression screen PHQ 2/9  Decreased Interest 3 0 1 3 1   Down, Depressed, Hopeless 0 0 0 3 1  PHQ - 2 Score 3 0 1 6 2   Altered sleeping 2 1  2  0  Tired, decreased energy 1 1  3 1   Change in appetite 1 1  3 1   Feeling bad or failure about yourself  0 0  2 1  Trouble concentrating 1 0  2 0  Moving slowly or fidgety/restless 0 0  1 1  Suicidal thoughts 0 0  2 0  PHQ-9 Score 8 3  21 6   Difficult doing work/chores Not difficult at all Not difficult at all           02/10/2024   10:37 AM 05/16/2023    8:58 AM 10/30/2022    4:30 PM 06/07/2022    2:39 PM  GAD 7 :  Generalized Anxiety Score  Nervous, Anxious, on Edge 1 1 2 3   Control/stop worrying 0 0 2 3  Worry too much - different things 0 0 1 2  Trouble relaxing 0 0 1 2  Restless 0 0 1 1  Easily annoyed or irritable 1 1 2  0  Afraid - awful might happen 0 0 0 1  Total GAD 7 Score 2 2 9 12   Anxiety Difficulty Not difficult at all Not difficult at all      ROS: See pertinent positives and negatives per HPI.    Patient Active Problem List   Diagnosis Date Noted   Marijuana use 02/04/2018   Atopic dermatitis 07/24/2015   Chronic headaches 06/01/2015   Generalized anxiety disorder 06/01/2015    Social History   Tobacco Use   Smoking status: Never   Smokeless tobacco: Never  Substance Use Topics   Alcohol use: No    Current Outpatient Medications:    norgestrel-ethinyl estradiol (LO/OVRAL) 0.3-30 MG-MCG tablet, TAKE 1 TABLET BY MOUTH EVERY DAY, Disp: , Rfl:  triamcinolone cream (KENALOG) 0.1 %, Apply 1 Application topically 2 (two) times daily., Disp: 30 g, Rfl: 5   azithromycin (ZITHROMAX) 250 MG tablet, Take 2 tablets on day 1, then 1 tablet daily on days 2 through 5, Disp: 6 tablet, Rfl: 0   fluticasone (FLONASE) 50 MCG/ACT nasal spray, Place 2 sprays into both nostrils daily., Disp: 16 g, Rfl: 6   omeprazole (PRILOSEC) 40 MG capsule, TAKE 1 CAPSULE (40 MG TOTAL) BY MOUTH DAILY. (Patient not taking: Reported on 05/16/2023), Disp: 90 capsule, Rfl: 0   sertraline (ZOLOFT) 100 MG tablet, Take 1.5 tablets (150 mg total) by mouth daily., Disp: 135 tablet, Rfl: 1  Allergies  Allergen Reactions   Keflex [Cephalexin]     OBJECTIVE:  BP 122/82   Pulse 75   Temp 98.2 F (36.8 C)   Wt 151 lb (68.5 kg)   SpO2 99%   BMI 25.13 kg/m  Physical Exam Vitals and nursing note reviewed.  Constitutional:      General: She is not in acute distress.    Appearance: Normal appearance. She is normal weight. She is not ill-appearing or toxic-appearing.  HENT:     Head: Normocephalic and  atraumatic.     Mouth/Throat:     Mouth: Mucous membranes are moist.  Eyes:     Extraocular Movements: Extraocular movements intact.     Conjunctiva/sclera: Conjunctivae normal.     Pupils: Pupils are equal, round, and reactive to light.  Cardiovascular:     Rate and Rhythm: Normal rate and regular rhythm.     Heart sounds: No murmur heard. Pulmonary:     Effort: Pulmonary effort is normal. No respiratory distress.     Breath sounds: Normal breath sounds. No wheezing, rhonchi or rales.  Musculoskeletal:     Right lower leg: No edema.     Left lower leg: No edema.  Skin:    Findings: No rash.  Neurological:     Mental Status: She is alert and oriented to person, place, and time. Mental status is at baseline.  Psychiatric:        Mood and Affect: Mood normal.        Behavior: Behavior normal.        Thought Content: Thought content normal.        Judgment: Judgment normal.     ASSESSMENT AND PLAN: Patricia Willis is a 23 y.o. female present for  Generalized anxiety disorder stable Continue Zoloft to 150 mg daily.    Return in about 25 weeks (around 08/03/2024) for Routine chronic condition follow-up.   No orders of the defined types were placed in this encounter.  Meds ordered this encounter  Medications   sertraline (ZOLOFT) 100 MG tablet    Sig: Take 1.5 tablets (150 mg total) by mouth daily.    Dispense:  135 tablet    Refill:  1   Referral Orders  No referral(s) requested today     Napolean Backbone, DO 02/10/2024

## 2024-02-16 DIAGNOSIS — Z01419 Encounter for gynecological examination (general) (routine) without abnormal findings: Secondary | ICD-10-CM | POA: Diagnosis not present

## 2024-03-24 ENCOUNTER — Telehealth: Admitting: Family Medicine

## 2024-03-24 ENCOUNTER — Encounter: Payer: Self-pay | Admitting: Family Medicine

## 2024-03-24 DIAGNOSIS — L237 Allergic contact dermatitis due to plants, except food: Secondary | ICD-10-CM | POA: Diagnosis not present

## 2024-03-24 MED ORDER — PREDNISONE 10 MG PO TABS
ORAL_TABLET | ORAL | 0 refills | Status: AC
Start: 2024-03-24 — End: ?

## 2024-03-24 NOTE — Progress Notes (Signed)
E-Visit for Poison Ivy  We are sorry that you are not feeing well.  Here is how we plan to help!  Based on what you have shared with me it looks like you have had an allergic reaction to the oily resin from a group of plants.  This resin is very sticky, so it easily attaches to your skin, clothing, tools equipment, and pet's fur.    This blistering rash is often called poison ivy rash although it can come from contact with the leaves, stems and roots of poison ivy, poison oak and poison sumac.  The oily resin contains urushiol (u-ROO-she-ol) that produces a skin rash on exposed skin.  The severity of the rash depends on the amount of urushiol that gets on your skin.  A section of skin with more urushiol on it may develop a rash sooner.  The rash usually develops 12-48 hours after exposure and can last two to three weeks.  Your skin must come in direct contact with the plant's oil to be affected.  Blister fluid doesn't spread the rash.  However, if you come into contact with a piece of clothing or pet fur that has urushiol on it, the rash may spread out.  You can also transfer the oil to other parts of your body with your fingers.  Often the rash looks like a straight line because of the way the plant brushes against your skin.  Since your rash is widespread or has resulted in a large number of blisters, I have prescribed an oral corticosteroid.  Please follow these recommendations:  I have sent a prednisone dose pack to your chosen pharmacy. Be sure to follow the instructions carefully and complete the entire prescription. You may use Benadryl or Caladryl topical lotions to sooth the itch and remember cool, not hot, showers and baths can help relieve the itching!  Place cool, wet compresses on the affected area for 15-30 minutes several times a day.  You may also take oral antihistamines, such as diphenhydramine (Benadryl, others), which may also help you sleep better.  Watch your skin for any purulent  (pus) drainage or red streaking from the site.  If this occurs, contact your provider.  You may require an antibiotic for a skin infection.  Make sure that the clothes you were wearing as well as any towels or sheets that may have come in contact with the oil (urushiol) are washed in detergent and hot water.       I have developed the following plan to treat your condition I am prescribing a two week course of steroids (37 tablets of 10 mg prednisone).  Days 1-4 take 4 tablets (40 mg) daily  Days 5-8 take 3 tablets (30 mg) daily, Days 9-11 take 2 tablets (20 mg) daily, Days 12-14 take 1 tablet (10 mg) daily.    What can you do to prevent this rash?  Avoid the plants.  Learn how to identify poison ivy, poison oak and poison sumac in all seasons.  When hiking or engaging in other activities that might expose you to these plants, try to stay on cleared pathways.  If camping, make sure you pitch your tent in an area free of these plants.  Keep pets from running through wooded areas so that urushiol doesn't accidentally stick to their fur, which you may touch.  Remove or kill the plants.  In your yard, you can get rid of poison ivy by applying an herbicide or pulling it out of   the ground, including the roots, while wearing heavy gloves.  Afterward remove the gloves and thoroughly wash them and your hands.  Don't burn poison ivy or related plants because the urushiol can be carried by smoke.  Wear protective clothing.  If needed, protect your skin by wearing socks, boots, pants, long sleeves and vinyl gloves.  Wash your skin right away.  Washing off the oil with soap and water within 30 minutes of exposure may reduce your chances of getting a poison ivy rash.  Even washing after an hour or so can help reduce the severity of the rash.  If you walk through some poison ivy and then later touch your shoes, you may get some urushiol on your hands, which may then transfer to your face or body by touching or  rubbing.  If the contaminated object isn't cleaned, the urushiol on it can still cause a skin reaction years later.    Be careful not to reuse towels after you have washed your skin.  Also carefully wash clothing in detergent and hot water to remove all traces of the oil.  Handle contaminated clothing carefully so you don't transfer the urushiol to yourself, furniture, rugs or appliances.  Remember that pets can carry the oil on their fur and paws.  If you think your pet may be contaminated with urushiol, put on some long rubber gloves and give your pet a bath.  Finally, be careful not to burn these plants as the smoke can contain traces of the oil.  Inhaling the smoke may result in difficulty breathing. If that occurred you should see a physician as soon as possible.  See your doctor right away if:  The reaction is severe or widespread You inhaled the smoke from burning poison ivy and are having difficulty breathing Your skin continues to swell The rash affects your eyes, mouth or genitals Blisters are oozing pus You develop a fever greater than 100 F (37.8 C) The rash doesn't get better within a few weeks.  If you scratch the poison ivy rash, bacteria under your fingernails may cause the skin to become infected.  See your doctor if pus starts oozing from the blisters.  Treatment generally includes antibiotics.  Poison ivy treatments are usually limited to self-care methods.  And the rash typically goes away on its own in two to three weeks.     If the rash is widespread or results in a large number of blisters, your doctor may prescribe an oral corticosteroid, such as prednisone.  If a bacterial infection has developed at the rash site, your doctor may give you a prescription for an oral antibiotic.  MAKE SURE YOU  Understand these instructions. Will watch your condition. Will get help right away if you are not doing well or get worse.   Thank you for choosing an e-visit.  Your  e-visit answers were reviewed by a board certified advanced clinical practitioner to complete your personal care plan. Depending upon the condition, your plan could have included both over the counter or prescription medications.  Please review your pharmacy choice. Make sure the pharmacy is open so you can pick up prescription now. If there is a problem, you may contact your provider through MyChart messaging and have the prescription routed to another pharmacy.  Your safety is important to us. If you have drug allergies check your prescription carefully.   For the next 24 hours you can use MyChart to ask questions about today's visit, request a non-urgent   call back, or ask for a work or school excuse. You will get an email in the next two days asking about your experience. I hope that your e-visit has been valuable and will speed your recovery.   I provided 5 minutes of non face-to-face time during this encounter for chart review, medication and order placement, as well as and documentation.

## 2024-04-13 ENCOUNTER — Telehealth: Admitting: Family Medicine

## 2024-04-13 DIAGNOSIS — J019 Acute sinusitis, unspecified: Secondary | ICD-10-CM

## 2024-04-13 DIAGNOSIS — B9689 Other specified bacterial agents as the cause of diseases classified elsewhere: Secondary | ICD-10-CM

## 2024-04-13 MED ORDER — AMOXICILLIN-POT CLAVULANATE 875-125 MG PO TABS: 1.0000 | ORAL_TABLET | Freq: Two times a day (BID) | ORAL | 0 refills | Status: DC

## 2024-04-13 NOTE — Progress Notes (Signed)

## 2024-04-19 ENCOUNTER — Ambulatory Visit: Admitting: Family Medicine

## 2024-04-19 VITALS — BP 126/82 | HR 72 | Temp 98.3°F | Wt 153.0 lb

## 2024-04-19 DIAGNOSIS — B9689 Other specified bacterial agents as the cause of diseases classified elsewhere: Secondary | ICD-10-CM

## 2024-04-19 DIAGNOSIS — M79672 Pain in left foot: Secondary | ICD-10-CM | POA: Diagnosis not present

## 2024-04-19 DIAGNOSIS — T887XXA Unspecified adverse effect of drug or medicament, initial encounter: Secondary | ICD-10-CM | POA: Diagnosis not present

## 2024-04-19 DIAGNOSIS — J329 Chronic sinusitis, unspecified: Secondary | ICD-10-CM | POA: Diagnosis not present

## 2024-04-19 MED ORDER — DOXYCYCLINE HYCLATE 100 MG PO TABS: 100.0000 mg | ORAL_TABLET | Freq: Two times a day (BID) | ORAL | 0 refills | Status: DC

## 2024-04-19 MED ORDER — FLUTICASONE PROPIONATE 50 MCG/ACT NA SUSP: 2.0000 | Freq: Every day | NASAL | 6 refills | Status: DC

## 2024-04-19 NOTE — Patient Instructions (Signed)

## 2024-04-19 NOTE — Progress Notes (Signed)
 Patricia Willis , 2000-12-06, 23 y.o., female MRN: [id number] Patient Care Team    Relationship Specialty Notifications Start End  Patricia Willis LABOR, DO PCP - General Family Medicine  06/01/15   Patricia Packer, PA-C Physician Assistant Obstetrics and Gynecology  03/07/20     Chief Complaint  Patient presents with   Medication Reaction    Nausea, headaches, loss of appetite due to taking Augmentin/Amoxicillin .       Subjective: Patricia Willis is a 23 y.o. Pt presents for an OV with complaints of side effects to Augmentin prescribed 04/13/2024 by another provider for a sinus infection.  Patient had reported feeling more sweating, loss of appetite, nausea and diarrhea-?  Antibiotic versus illness.  She has had amoxicillin  in the past and tolerated. She also complained of a swollen foot, it was painful to walk on.  She does not recall an injury. Patient reports she was prescribed Augmentin and 2.5 days worth.  It caused rather significant nausea, vomiting and diarrhea.  She was unable to tolerate any or additional doses.  She had thought it started to help, but still complains of rather significant congestion, cough with green sputum.     02/10/2024   10:37 AM 05/16/2023    8:58 AM 10/30/2022    4:30 PM 06/07/2022    2:39 PM 04/16/2022    2:27 PM  Depression screen PHQ 2/9  Decreased Interest 3 0 1 3 1   Down, Depressed, Hopeless 0 0 0 3 1  PHQ - 2 Score 3 0 1 6 2   Altered sleeping 2 1  2  0  Tired, decreased energy 1 1  3 1   Change in appetite 1 1  3 1   Feeling bad or failure about yourself  0 0  2 1  Trouble concentrating 1 0  2 0  Moving slowly or fidgety/restless 0 0  1 1  Suicidal thoughts 0 0  2 0  PHQ-9 Score 8 3  21 6   Difficult doing work/chores Not difficult at all Not difficult at all       Allergies  Allergen Reactions   Keflex [Cephalexin]    Social History   Social History Narrative   Lives with mother only (has older brother that is an adult).   Wears her seatbelt,  wears sunscreen.    Smoke detector in the home.    Feels safe in her relationships.    Past Medical History:  Diagnosis Date   Allergic rhinitis    Anxiety with somatization    Atopic dermatitis    Constipation    Depression    Headache    Sleep disturbances    Past Surgical History:  Procedure Laterality Date   TONSILLECTOMY     Family History  Problem Relation Age of Onset   Hypertension Maternal Grandmother    Allergies as of 04/19/2024       Reactions   Keflex [cephalexin]         Medication List        Accurate as of April 19, 2024  1:42 PM. If you have any questions, ask your nurse or doctor.          STOP taking these medications    amoxicillin -clavulanate 875-125 MG tablet Commonly known as: AUGMENTIN Stopped by: Willis Patricia       TAKE these medications    doxycycline 100 MG tablet Commonly known as: VIBRA-TABS Take 1 tablet (100 mg total) by mouth 2 (two) times daily.  Started by: Patricia Willis   fluticasone  50 MCG/ACT nasal spray Commonly known as: FLONASE  Place 2 sprays into both nostrils daily. Started by: Patricia Willis   norgestrel-ethinyl estradiol  0.3-30 MG-MCG tablet Commonly known as: LO/OVRAL TAKE 1 TABLET BY MOUTH EVERY DAY   sertraline  100 MG tablet Commonly known as: ZOLOFT  Take 1.5 tablets (150 mg total) by mouth daily.   triamcinolone  cream 0.1 % Commonly known as: KENALOG  Apply 1 Application topically 2 (two) times daily.        All past medical history, surgical history, allergies, family history, immunizations andmedications were updated in the EMR today and reviewed under the history and medication portions of their EMR.     ROS Negative, with the exception of above mentioned in HPI   Objective:  BP 126/82   Pulse 72   Temp 98.3 F (36.8 C)   Wt 153 lb (69.4 kg)   SpO2 98%   BMI 25.46 kg/m  Body mass index is 25.46 kg/m. Physical Exam Vitals and nursing note reviewed.  Constitutional:      General:  She is not in acute distress.    Appearance: Normal appearance. She is normal weight. She is not ill-appearing or toxic-appearing.  HENT:     Head: Normocephalic and atraumatic.     Right Ear: Tympanic membrane, ear canal and external ear normal.     Left Ear: Tympanic membrane, ear canal and external ear normal.     Nose: Congestion and rhinorrhea present.     Right Turbinates: Enlarged and swollen.     Left Turbinates: Enlarged and swollen.     Right Sinus: Maxillary sinus tenderness present.     Left Sinus: Maxillary sinus tenderness present.     Mouth/Throat:     Mouth: Mucous membranes are moist.     Pharynx: Posterior oropharyngeal erythema present. No oropharyngeal exudate.   Eyes:     General: No scleral icterus.       Right eye: No discharge.        Left eye: No discharge.     Extraocular Movements: Extraocular movements intact.     Conjunctiva/sclera: Conjunctivae normal.     Pupils: Pupils are equal, round, and reactive to light.    Cardiovascular:     Rate and Rhythm: Normal rate and regular rhythm.  Pulmonary:     Effort: Pulmonary effort is normal. No respiratory distress.     Breath sounds: Rhonchi present. No wheezing or rales.   Musculoskeletal:        General: Tenderness present. No swelling, deformity or signs of injury. Normal range of motion.     Cervical back: Neck supple.     Right lower leg: No edema.     Left lower leg: No edema.  Lymphadenopathy:     Cervical: No cervical adenopathy.   Skin:    Findings: No erythema or rash.   Neurological:     Mental Status: She is alert and oriented to person, place, and time. Mental status is at baseline.     Motor: No weakness.     Coordination: Coordination normal.     Gait: Gait normal.   Psychiatric:        Mood and Affect: Mood normal.        Behavior: Behavior normal.        Thought Content: Thought content normal.        Judgment: Judgment normal.      No results found. No results found. No  results found  for this or any previous visit (from the past 24 hours).  Assessment/Plan: Patricia Willis is a 23 y.o. female present for OV for  Bacterial sinusitis Rest, hydrate.  Nasal saline flushes recommended Doxy BID prescribed, take until completed.  Start Flonase  1 spray each nostril daily.  Rather significant swelling of turbinates laterally, right greater than left. If cough present it can last up to 6-8 weeks.  F/U 2 weeks if not improved.   Medication side effect Significant GI side effects with Augmentin use.  She has been able to tolerate amoxicillin  in the past.  Encouraged her to avoid use of Augmentin in the future unless absolutely necessary.  We discussed this is not an allergy, it is an intolerable side effect.  Foot pain: No swelling or erythema today very mild discomfort.  Reassured.  If worsening encouraged her to return for further evaluation x-ray at that time.  No red flags on exam.   Reviewed expectations re: course of current medical issues. Discussed self-management of symptoms. Outlined signs and symptoms indicating need for more acute intervention. Patient verbalized understanding and all questions were answered. Patient received an After-Visit Summary.    No orders of the defined types were placed in this encounter.  Meds ordered this encounter  Medications   doxycycline (VIBRA-TABS) 100 MG tablet    Sig: Take 1 tablet (100 mg total) by mouth 2 (two) times daily.    Dispense:  14 tablet    Refill:  0   fluticasone  (FLONASE ) 50 MCG/ACT nasal spray    Sig: Place 2 sprays into both nostrils daily.    Dispense:  16 g    Refill:  6   Referral Orders  No referral(s) requested today     Note is dictated utilizing voice recognition software. Although note has been proof read prior to signing, occasional typographical errors still can be missed. If any questions arise, please do not hesitate to call for verification.   electronically signed  by:  Willis Bellini, DO  [facility] [place]

## 2024-09-02 ENCOUNTER — Other Ambulatory Visit: Payer: Self-pay

## 2024-09-02 DIAGNOSIS — F411 Generalized anxiety disorder: Secondary | ICD-10-CM

## 2024-09-02 MED ORDER — SERTRALINE HCL 100 MG PO TABS: 150.0000 mg | ORAL_TABLET | Freq: Every day | ORAL | 0 refills | Status: DC

## 2024-09-03 ENCOUNTER — Ambulatory Visit: Admitting: Family Medicine

## 2024-09-03 VITALS — BP 124/80 | HR 80 | Temp 98.1°F | Wt 148.0 lb

## 2024-09-03 DIAGNOSIS — F411 Generalized anxiety disorder: Secondary | ICD-10-CM

## 2024-09-03 MED ORDER — SERTRALINE HCL 100 MG PO TABS: 150.0000 mg | ORAL_TABLET | Freq: Every day | ORAL | 1 refills | Status: AC

## 2024-09-03 MED ORDER — BUSPIRONE HCL 10 MG PO TABS
10.0000 mg | ORAL_TABLET | Freq: Two times a day (BID) | ORAL | 1 refills | Status: DC
Start: 2024-09-03 — End: 2024-09-21

## 2024-09-03 NOTE — Progress Notes (Signed)
 Patient Care Team    Relationship Specialty Notifications Start End  Catherine Charlies LABOR, DO PCP - General Family Medicine  06/01/15   Colletta Packer, PA-C Physician Assistant Obstetrics and Gynecology  03/07/20     SUBJECTIVE Chief Complaint  Patient presents with   Anxiety    Pt wanting to discuss the dosage of Zoloft .     HPI: Patricia Willis is a 23 y.o. female present for  Anxiety:  Today patient reports compliance with zoloft  150 mg every day. She again reports increased anxiousness.  She states she will feel her heart start racing and her palms will become sweaty.  She states this happens every day now, but does not last every day.  She feels the symptoms do sometimes occur in times when she would have been more anxious, but not every time.  She reports for example she had sweaty palms and anxious feelings when she was driving here today and she made this drive 899 sometimes before without symptoms. Prior note Her job role has changed little and she is now working in her families shop.  She was able to by herself a new car ([employer] 2021).   Prior note:  Presents for follow-up on her anxiety today. She states that she has been taking the 150 mg of Zoloft  daily. She denies any negative side effects from the medication. She reports increased anxiety currently secondary to a relationship. She has been dating a young man for approximately one year, and she is having problems in that relationship. She has also graduated early this year in December, but will not walk with her class until June. She currently does not have a job and is deciding if she wants to attend college. She states she is not a school type of person and really does not want to attend college. She also states she would like to be involved in exercise science/physiology.  Review of Systems  All other systems reviewed and are negative.       09/03/2024    1:16 PM 02/10/2024   10:37 AM 05/16/2023    8:58 AM 10/30/2022    4:30 PM  06/07/2022    2:39 PM  Depression screen PHQ 2/9  Decreased Interest 2 3 0 1 3  Down, Depressed, Hopeless 1 0 0 0 3  PHQ - 2 Score 3 3 0 1 6  Altered sleeping 2 2 1  2   Tired, decreased energy 3 1 1  3   Change in appetite 1 1 1  3   Feeling bad or failure about yourself  0 0 0  2  Trouble concentrating 2 1 0  2  Moving slowly or fidgety/restless 1 0 0  1  Suicidal thoughts 0 0 0  2  PHQ-9 Score 12 8  3   21    Difficult doing work/chores Somewhat difficult Not difficult at all Not difficult at all       Data saved with a previous flowsheet row definition        09/03/2024    1:16 PM 02/10/2024   10:37 AM 05/16/2023    8:58 AM 10/30/2022    4:30 PM  GAD 7 : Generalized Anxiety Score  Nervous, Anxious, on Edge 3 1 1 2   Control/stop worrying 2 0 0 2  Worry too much - different things 2 0 0 1  Trouble relaxing 1 0 0 1  Restless 1 0 0 1  Easily annoyed or irritable 2 1 1 2   Afraid - awful  might happen 0 0 0 0  Total GAD 7 Score 11 2 2 9   Anxiety Difficulty Somewhat difficult Not difficult at all Not difficult at all     ROS: See pertinent positives and negatives per HPI.    Patient Active Problem List   Diagnosis Date Noted   Marijuana use 02/04/2018   Atopic dermatitis 07/24/2015   Chronic headaches 06/01/2015   Generalized anxiety disorder 06/01/2015    Social History   Tobacco Use   Smoking status: Never   Smokeless tobacco: Never  Substance Use Topics   Alcohol use: No    Current Outpatient Medications:    busPIRone (BUSPAR) 10 MG tablet, Take 1 tablet (10 mg total) by mouth 2 (two) times daily., Disp: 180 tablet, Rfl: 1   norgestrel-ethinyl estradiol  (LO/OVRAL) 0.3-30 MG-MCG tablet, TAKE 1 TABLET BY MOUTH EVERY DAY, Disp: , Rfl:    triamcinolone  cream (KENALOG ) 0.1 %, Apply 1 Application topically 2 (two) times daily., Disp: 30 g, Rfl: 5   fluticasone  (FLONASE ) 50 MCG/ACT nasal spray, Place 2 sprays into both nostrils daily., Disp: 16 g, Rfl: 6   sertraline   (ZOLOFT ) 100 MG tablet, Take 1.5 tablets (150 mg total) by mouth daily., Disp: 135 tablet, Rfl: 1  Allergies  Allergen Reactions   Keflex [Cephalexin]     OBJECTIVE:  BP 124/80   Pulse 80   Temp 98.1 F (36.7 C)   Wt 148 lb (67.1 kg)   SpO2 98%   BMI 24.63 kg/m  Physical Exam Vitals and nursing note reviewed.  Constitutional:      General: She is not in acute distress.    Appearance: Normal appearance. She is normal weight. She is not ill-appearing or toxic-appearing.  HENT:     Head: Normocephalic and atraumatic.  Eyes:     General: No scleral icterus.       Right eye: No discharge.        Left eye: No discharge.     Extraocular Movements: Extraocular movements intact.     Conjunctiva/sclera: Conjunctivae normal.     Pupils: Pupils are equal, round, and reactive to light.  Skin:    Findings: No rash.  Neurological:     Mental Status: She is alert and oriented to person, place, and time. Mental status is at baseline.     Motor: No weakness.     Coordination: Coordination normal.     Gait: Gait normal.  Psychiatric:        Mood and Affect: Mood normal.        Behavior: Behavior normal.        Thought Content: Thought content normal.        Judgment: Judgment normal.     ASSESSMENT AND PLAN: Patricia Willis is a 23 y.o. female present for  Generalized anxiety disorder Stable Continue Zoloft  to 150 mg daily.   Added Buspar taper to 10 mg bid We discussed beta-blocker addition if necessary or switching to a different SSRI altogether, if symptoms do not improve for her.   Return in about 24 weeks (around 02/18/2025).   No orders of the defined types were placed in this encounter.  Meds ordered this encounter  Medications   sertraline  (ZOLOFT ) 100 MG tablet    Sig: Take 1.5 tablets (150 mg total) by mouth daily.    Dispense:  135 tablet    Refill:  1   busPIRone (BUSPAR) 10 MG tablet    Sig: Take 1 tablet (10 mg total) by mouth  2 (two) times daily.     Dispense:  180 tablet    Refill:  1   Referral Orders  No referral(s) requested today     Charlies Bellini, DO 09/03/2024

## 2024-09-03 NOTE — Patient Instructions (Addendum)
 No follow-ups on file.  Buspar start with 1/2 tab in the morning for 3 days, then 1/2 tab twice a day for 3 days, then 1 tab morning/1/2 tab afternoon 3 days, then 1 tab twice a day    Great to see you today.  I have refilled the medication(s) we provide.   If labs were collected or images ordered, we will inform you of  results once we have received them and reviewed. We will contact you either by echart message, or telephone call.  Please give ample time to the testing facility, and our office to run,  receive and review results. Please do not call inquiring of results, even if you can see them in your chart. We will contact you as soon as we are able. If it has been over 1 week since the test was completed, and you have not yet heard from us , then please call us .    - echart message- for normal results that have been seen by the patient already.   - telephone call: abnormal results or if patient has not viewed results in their echart.  If a referral to a specialist was entered for you, please call us  in 2 weeks if you have not heard from the specialist office to schedule.

## 2024-09-07 ENCOUNTER — Telehealth: Admitting: Physician Assistant

## 2024-09-07 DIAGNOSIS — L237 Allergic contact dermatitis due to plants, except food: Secondary | ICD-10-CM | POA: Diagnosis not present

## 2024-09-07 MED ORDER — PREDNISONE 10 MG (21) PO TBPK: ORAL_TABLET | ORAL | 0 refills | Status: DC

## 2024-09-07 NOTE — Progress Notes (Signed)

## 2024-09-14 ENCOUNTER — Encounter: Payer: Self-pay | Admitting: Family Medicine

## 2024-09-15 ENCOUNTER — Telehealth: Admitting: Physician Assistant

## 2024-09-15 DIAGNOSIS — L237 Allergic contact dermatitis due to plants, except food: Secondary | ICD-10-CM

## 2024-09-15 MED ORDER — PREDNISONE 10 MG PO TABS: ORAL_TABLET | ORAL | 0 refills | Status: DC

## 2024-09-15 NOTE — Progress Notes (Signed)
 We are sorry that you are not feeing well.  Here is how we plan to help!  Based on what you have shared with me it looks like you have had an allergic reaction to the oily resin from a group of plants.  This resin is very sticky, so it easily attaches to your skin, clothing, tools equipment, and pet's fur.    This blistering rash is often called poison ivy rash although it can come from contact with the leaves, stems and roots of poison ivy, poison oak and poison sumac.  The oily resin contains urushiol (u-ROO-[id number]) that produces a skin rash on exposed skin.  The severity of the rash depends on the amount of urushiol that gets on your skin.  A section of skin with more urushiol on it may develop a rash sooner.  The rash usually develops 12-48 hours after exposure and can last two to three weeks.  Your skin must come in direct contact with the plant's oil to be affected.  Blister fluid doesn't spread the rash.  However, if you come into contact with a piece of clothing or pet fur that has urushiol on it, the rash may spread out.  You can also transfer the oil to other parts of your body with your fingers.  Often the rash looks like a straight line because of the way the plant brushes against your skin.    Giving this rebound dermatitis, I have sent in a prolonged taper of prednisone  to take as directed to make sure this is fully resolved.  What can you do to prevent this rash?  Avoid the plants.  Learn how to identify poison ivy, poison oak and poison sumac in all seasons.  When hiking or engaging in other activities that might expose you to these plants, try to stay on cleared pathways.  If camping, make sure you pitch your tent in an area free of these plants.  Keep pets from running through wooded areas so that urushiol doesn't accidentally stick to their fur, which you may touch.  Remove or kill the plants.  In your yard, you can get rid of poison ivy by applying an herbicide or pulling it out  of the ground, including the roots, while wearing heavy gloves.  Afterward remove the gloves and thoroughly wash them and your hands.  Don't burn poison ivy or related plants because the urushiol can be carried by smoke.  Wear protective clothing.  If needed, protect your skin by wearing socks, boots, pants, long sleeves and vinyl gloves.  Wash your skin right away.  Washing off the oil with soap and water within 30 minutes of exposure may reduce your chances of getting a poison ivy rash.  Even washing after an hour or so can help reduce the severity of the rash.  If you walk through some poison ivy and then later touch your shoes, you may get some urushiol on your hands, which may then transfer to your face or body by touching or rubbing.  If the contaminated object isn't cleaned, the urushiol on it can still cause a skin reaction years later.    Be careful not to reuse towels after you have washed your skin.  Also carefully wash clothing in detergent and hot water to remove all traces of the oil.  Handle contaminated clothing carefully so you don't transfer the urushiol to yourself, furniture, rugs or appliances.  Remember that pets can carry the oil on their fur and paws.  If you think your pet may be contaminated with urushiol, put on some long rubber gloves and give your pet a bath.  Finally, be careful not to burn these plants as the smoke can contain traces of the oil.  Inhaling the smoke may result in difficulty breathing. If that occurred you should see a physician as soon as possible.  See your doctor right away if:  The reaction is severe or widespread You inhaled the smoke from burning poison ivy and are having difficulty breathing Your skin continues to swell The rash affects your eyes, mouth or genitals Blisters are oozing pus You develop a fever greater than 100 F (37.8 C) The rash doesn't get better within a few weeks.  If you scratch the poison ivy rash, bacteria under your  fingernails may cause the skin to become infected.  See your doctor if pus starts oozing from the blisters.  Treatment generally includes antibiotics.  Poison ivy treatments are usually limited to self-care methods.  And the rash typically goes away on its own in two to three weeks.     If the rash is widespread or results in a large number of blisters, your doctor may prescribe an oral corticosteroid, such as prednisone .  If a bacterial infection has developed at the rash site, your doctor may give you a prescription for an oral antibiotic.  MAKE SURE YOU  Understand these instructions. Will watch your condition. Will get help right away if you are not doing well or get worse.  Thank you for choosing an e-visit. Your e-visit answers were reviewed by a board certified advanced clinical practitioner to complete your personal care plan. Depending upon the condition, your plan could have included both over the counter or prescription medications.  Please review your pharmacy choice. If there is a problem you may use MyChart messaging to have the prescription routed to another pharmacy.   Your safety is important to us . If you have drug allergies check your prescription carefully.  You can use MyChart to ask questions about today's visit, request a non-urgent call back, or ask for a work or school excuse for 24 hours related to this e-Visit. If it has been greater than 24 hours you will need to follow up with your provider, or enter a new e-Visit to address those concerns.   You will get an email in the next two days asking about your experience. I hope that your e-visit has been valuable and will speed your recovery Thank you for choosing an e-visit.   I have spent 5 minutes in review of e-visit questionnaire, review and updating patient chart, medical decision making and response to patient.   Elsie Velma Lunger, PA-C

## 2024-09-21 ENCOUNTER — Encounter: Payer: Self-pay | Admitting: Family Medicine

## 2024-09-21 ENCOUNTER — Ambulatory Visit (INDEPENDENT_AMBULATORY_CARE_PROVIDER_SITE_OTHER): Admitting: Family Medicine

## 2024-09-21 ENCOUNTER — Other Ambulatory Visit (HOSPITAL_COMMUNITY)
Admission: RE | Admit: 2024-09-21 | Discharge: 2024-09-21 | Disposition: A | Discharge status: Home / Self Care | Source: Ambulatory Visit | Attending: Family Medicine | Admitting: Family Medicine

## 2024-09-21 VITALS — BP 120/80 | HR 81 | Temp 98.2°F | Ht 65.0 in | Wt 146.2 lb

## 2024-09-21 DIAGNOSIS — Z114 Encounter for screening for human immunodeficiency virus [HIV]: Secondary | ICD-10-CM

## 2024-09-21 DIAGNOSIS — Z118 Encounter for screening for other infectious and parasitic diseases: Secondary | ICD-10-CM | POA: Insufficient documentation

## 2024-09-21 DIAGNOSIS — Z79899 Other long term (current) drug therapy: Secondary | ICD-10-CM

## 2024-09-21 DIAGNOSIS — Z1159 Encounter for screening for other viral diseases: Secondary | ICD-10-CM | POA: Insufficient documentation

## 2024-09-21 DIAGNOSIS — Z131 Encounter for screening for diabetes mellitus: Secondary | ICD-10-CM

## 2024-09-21 DIAGNOSIS — Z0000 Encounter for general adult medical examination without abnormal findings: Secondary | ICD-10-CM | POA: Diagnosis not present

## 2024-09-21 DIAGNOSIS — Z13220 Encounter for screening for lipoid disorders: Secondary | ICD-10-CM | POA: Diagnosis not present

## 2024-09-21 LAB — LIPID PANEL
Cholesterol: 232 mg/dL — ABNORMAL HIGH (ref 0–200)
HDL: 92.2 mg/dL (ref >39.00)
LDL Cholesterol: 122 mg/dL — ABNORMAL HIGH (ref 0–99)
NonHDL: 139.5
Total CHOL/HDL Ratio: 3
Triglycerides: 89 mg/dL (ref 0.0–149.0)
VLDL: 17.8 mg/dL (ref 0.0–40.0)

## 2024-09-21 LAB — CBC
HCT: 42.9 % (ref 36.0–46.0)
Hemoglobin: 14.5 g/dL (ref 12.0–15.0)
MCHC: 33.8 g/dL (ref 30.0–36.0)
MCV: 94.9 fl (ref 78.0–100.0)
Platelets: 236 K/uL (ref 150.0–400.0)
RBC: 4.52 Mil/uL (ref 3.87–5.11)
RDW: 12.7 % (ref 11.5–15.5)
WBC: 8.8 K/uL (ref 4.0–10.5)

## 2024-09-21 LAB — COMPREHENSIVE METABOLIC PANEL WITH GFR
ALT: 15 U/L (ref 0–35)
AST: 15 U/L (ref 0–37)
Albumin: 4.8 g/dL (ref 3.5–5.2)
Alkaline Phosphatase: 52 U/L (ref 39–117)
BUN: 13 mg/dL (ref 6–23)
CO2: 30 meq/L (ref 19–32)
Calcium: 9.7 mg/dL (ref 8.4–10.5)
Chloride: 101 meq/L (ref 96–112)
Creatinine, Ser: 1 mg/dL (ref 0.40–1.20)
GFR: 78.16 mL/min (ref >60.00)
Glucose, Bld: 113 mg/dL — ABNORMAL HIGH (ref 70–99)
Potassium: 4.5 meq/L (ref 3.5–5.1)
Sodium: 138 meq/L (ref 135–145)
Total Bilirubin: 0.6 mg/dL (ref 0.2–1.2)
Total Protein: 7.2 g/dL (ref 6.0–8.3)

## 2024-09-21 LAB — HEMOGLOBIN A1C: Hgb A1c MFr Bld: 5.3 % (ref 4.6–6.5)

## 2024-09-21 LAB — TSH: TSH: 0.72 u[IU]/mL (ref 0.35–5.50)

## 2024-09-21 MED ORDER — BUSPIRONE HCL 15 MG PO TABS: 15.0000 mg | ORAL_TABLET | Freq: Two times a day (BID) | ORAL | 1 refills | Status: AC

## 2024-09-21 NOTE — Patient Instructions (Addendum)
 Return in about 20 weeks (around 02/08/2025) for Routine chronic condition follow-up.        Great to see you today.  I have refilled the medication(s) we provide.   If labs were collected or images ordered, we will inform you of  results once we have received them and reviewed. We will contact you either by echart message, or telephone call.  Please give ample time to the testing facility, and our office to run,  receive and review results. Please do not call inquiring of results, even if you can see them in your chart. We will contact you as soon as we are able. If it has been over 1 week since the test was completed, and you have not yet heard from us , then please call us .    - echart message- for normal results that have been seen by the patient already.   - telephone call: abnormal results or if patient has not viewed results in their echart.  If a referral to a specialist was entered for you, please call us  in 2 weeks if you have not heard from the specialist office to schedule.

## 2024-09-21 NOTE — Progress Notes (Signed)
 Patient ID: Patricia Willis, female  DOB: 04/18/2001, 23 y.o.   MRN: [id number] Patient Care Team    Relationship Specialty Notifications Start End  Catherine Charlies LABOR, DO PCP - General Family Medicine  06/01/15   Colletta Packer, PA-C Physician Assistant Obstetrics and Gynecology  03/07/20     Chief Complaint  Patient presents with   Annual Exam   Thought simple on not Subjective:  Patricia Willis is a 23 y.o.  female present for CPE All past medical history, surgical history, allergies, family history, immunizations, medications and social history were updated in the electronic medical record today. All recent labs, ED visits and hospitalizations within the last year were reviewed.  Health maintenance:  Colon cancer screening: No family history.  Routine screening at 43  Mammogram: No family history routine screening 38 Cervical cancer screening: last pap: Completed 12/09/2022-gynecology Immunizations: tdap UTD 03/2016, Influenza declined (encouraged yearly) Infectious disease screening: HIV collected today, Hep C collected today, urine cytology collected today DEXA: Routine screening at 58 Patient has a Dental home. Hospitalizations/ED visits: Reviewed     09/21/2024    1:06 PM 09/03/2024    1:16 PM 02/10/2024   10:37 AM 05/16/2023    8:58 AM 10/30/2022    4:30 PM  Depression screen PHQ 2/9  Decreased Interest 2 2 3  0 1  Down, Depressed, Hopeless 1 1 0 0 0  PHQ - 2 Score 3 3 3  0 1  Altered sleeping 1 2 2 1    Tired, decreased energy 2 3 1 1    Change in appetite 2 1 1 1    Feeling bad or failure about yourself  1 0 0 0   Trouble concentrating 1 2 1  0   Moving slowly or fidgety/restless 1 1 0 0   Suicidal thoughts 0 0 0 0   PHQ-9 Score 11 12 8  3     Difficult doing work/chores Somewhat difficult Somewhat difficult Not difficult at all Not difficult at all      Data saved with a previous flowsheet row definition      09/21/2024    1:07 PM 09/03/2024    1:16 PM 02/10/2024   10:37  AM 05/16/2023    8:58 AM  GAD 7 : Generalized Anxiety Score  Nervous, Anxious, on Edge 1 3 1 1   Control/stop worrying 1 2 0 0  Worry too much - different things 1 2 0 0  Trouble relaxing 1 1 0 0  Restless 1 1 0 0  Easily annoyed or irritable 2 2 1 1   Afraid - awful might happen 0 0 0 0  Total GAD 7 Score 7 11 2 2   Anxiety Difficulty Somewhat difficult Somewhat difficult Not difficult at all Not difficult at all             09/21/2024    1:06 PM 09/03/2024    1:15 PM 02/10/2024   10:37 AM 05/16/2023    8:57 AM  Fall Risk   Falls in the past year? 0 0 0 0  Number falls in past yr: 0 0  0  Injury with Fall? 0 0  0  Risk for fall due to :    No Fall Risks  Follow up Falls evaluation completed Falls evaluation completed Falls evaluation completed Falls evaluation completed     Immunization History  Administered Date(s) Administered   DTaP 02/19/1999, 05/07/1999, 07/12/1999, 07/04/2000, 08/02/2003   HIB (PRP-OMP) 02/19/1999, 05/07/1999, 07/12/1999, 04/01/2000   HPV Quadrivalent 07/20/2009, 10/06/2009,  03/07/2010   Hepatitis A, Adult 06/12/2015   Hepatitis A, Ped/Adol-2 Dose 06/04/2016   Hepatitis B 07/12/1999, 09/27/1999, 04/01/2000   IPV 02/19/1999, 05/07/1999, 07/04/2000, 08/02/2003   MMR 12/25/1999, 08/02/2003   Meningococcal Conjugate 06/12/2015   Meningococcal polysaccharide vaccine (MPSV4) 09/24/2010   Pneumococcal Conjugate-13 09/27/1999, 12/25/1999, 04/01/2000   Td 04/26/2016   Tdap 07/20/2009   Varicella 12/25/1999, 01/06/2007    No results found.  Past Medical History:  Diagnosis Date   Allergic rhinitis    Anxiety with somatization    Atopic dermatitis    Constipation    Depression    Headache    Sleep disturbances    Allergies  Allergen Reactions   Keflex [Cephalexin]    Past Surgical History:  Procedure Laterality Date   TONSILLECTOMY     Family History  Problem Relation Age of Onset   Hypertension Maternal Grandmother    Social History    Social History Narrative   Lives with mother only (has older brother that is an adult).   Wears her seatbelt, wears sunscreen.    Smoke detector in the home.    Feels safe in her relationships.     Allergies as of 09/21/2024       Reactions   Keflex [cephalexin]         Medication List        Accurate as of September 21, 2024  1:14 PM. If you have any questions, ask your nurse or doctor.          STOP taking these medications    predniSONE  10 MG tablet Commonly known as: DELTASONE  Stopped by: Charlies Bellini       TAKE these medications    busPIRone  15 MG tablet Commonly known as: BUSPAR  Take 1 tablet (15 mg total) by mouth 2 (two) times daily. What changed:  medication strength how much to take Changed by: Dino Borntreger   norgestrel-ethinyl estradiol  0.3-30 MG-MCG tablet Commonly known as: LO/OVRAL TAKE 1 TABLET BY MOUTH EVERY DAY   sertraline  100 MG tablet Commonly known as: ZOLOFT  Take 1.5 tablets (150 mg total) by mouth daily.   triamcinolone  cream 0.1 % Commonly known as: KENALOG  Apply 1 Application topically 2 (two) times daily.        All past medical history, surgical history, allergies, family history, immunizations andmedications were updated in the EMR today and reviewed under the history and medication portions of their EMR.    No results found for this or any previous visit (from the past 2160 hours).  Review of Systems  Constitutional: Negative.   HENT: Negative.    Eyes: Negative.   Respiratory: Negative.    Cardiovascular: Negative.   Gastrointestinal: Negative.   Genitourinary: Negative.   Musculoskeletal: Negative.   Skin: Negative.   Neurological: Negative.   Endo/Heme/Allergies: Negative.   Psychiatric/Behavioral: Negative.    All other systems reviewed and are negative.  14 pt review of systems performed and negative (unless mentioned in an HPI)  Objective:  BP 120/80   Pulse 81   Temp 98.2 F (36.8 C)   Ht 5'  5 (1.651 m)   Wt 146 lb 3.2 oz (66.3 kg)   LMP 09/18/2024   SpO2 99%   BMI 24.33 kg/m   Physical Exam Vitals and nursing note reviewed.  Constitutional:      General: She is not in acute distress.    Appearance: Normal appearance. She is not ill-appearing or toxic-appearing.  HENT:     Head: Normocephalic and  atraumatic.     Right Ear: Tympanic membrane, ear canal and external ear normal. There is no impacted cerumen.     Left Ear: Tympanic membrane, ear canal and external ear normal. There is no impacted cerumen.     Nose: No congestion or rhinorrhea.     Mouth/Throat:     Mouth: Mucous membranes are moist.     Pharynx: Oropharynx is clear. No oropharyngeal exudate or posterior oropharyngeal erythema.  Eyes:     General: No scleral icterus.       Right eye: No discharge.        Left eye: No discharge.     Extraocular Movements: Extraocular movements intact.     Conjunctiva/sclera: Conjunctivae normal.     Pupils: Pupils are equal, round, and reactive to light.  Cardiovascular:     Rate and Rhythm: Normal rate and regular rhythm.     Pulses: Normal pulses.     Heart sounds: Normal heart sounds. No murmur heard.    No friction rub. No gallop.  Pulmonary:     Effort: Pulmonary effort is normal. No respiratory distress.     Breath sounds: Normal breath sounds. No stridor. No wheezing, rhonchi or rales.  Chest:     Chest wall: No tenderness.  Abdominal:     General: Abdomen is flat. Bowel sounds are normal. There is no distension.     Palpations: Abdomen is soft. There is no mass.     Tenderness: There is no abdominal tenderness. There is no right CVA tenderness, left CVA tenderness, guarding or rebound.     Hernia: No hernia is present.  Musculoskeletal:        General: No swelling, tenderness or deformity. Normal range of motion.     Cervical back: Normal range of motion and neck supple. No rigidity or tenderness.     Right lower leg: No edema.     Left lower leg: No  edema.  Lymphadenopathy:     Cervical: No cervical adenopathy.  Skin:    General: Skin is warm and dry.     Coloration: Skin is not jaundiced or pale.     Findings: No bruising, erythema, lesion or rash.  Neurological:     General: No focal deficit present.     Mental Status: She is alert and oriented to person, place, and time. Mental status is at baseline.     Cranial Nerves: No cranial nerve deficit.     Sensory: No sensory deficit.     Motor: No weakness.     Coordination: Coordination normal.     Gait: Gait normal.     Deep Tendon Reflexes: Reflexes normal.  Psychiatric:        Mood and Affect: Mood normal.        Behavior: Behavior normal.        Thought Content: Thought content normal.        Judgment: Judgment normal.       Assessment/plan: Patricia Willis is a 24 y.o. female present for CPE Encounter for screening for HIV - HIV antibody (with reflex) Screening for viral and chlamydial diseases - Urine cytology ancillary only Diabetes mellitus screening - Hemoglobin A1c Lipid screening - Lipid panel Encounter for hepatitis C screening test for low risk patient - Hepatitis C Antibody Encounter for long-term current use of medication - Hemoglobin A1c - Lipid panel Routine general medical examination at a health care facility (Primary) Patient was encouraged to exercise greater than 150 minutes a week.  Patient was encouraged to choose a diet filled with fresh fruits and vegetables, and lean meats. AVS provided to patient today for education/recommendation on gender specific health and safety maintenance. Colon cancer screening: No family history.  Routine screening at 43  Mammogram: No family history routine screening 38 Cervical cancer screening: last pap: Completed 12/09/2022-gynecology Immunizations: tdap UTD 03/2016, Influenza declined (encouraged yearly) Infectious disease screening: HIV collected today, Hep C collected today, urine cytology collected today DEXA:  Routine screening at 58  Return in about 20 weeks (around 02/08/2025) for Routine chronic condition follow-up.  Orders Placed This Encounter  Procedures   CBC   Comprehensive metabolic panel with GFR   Hemoglobin A1c   Lipid panel   TSH   HIV antibody (with reflex)   Hepatitis C Antibody   Meds ordered this encounter  Medications   busPIRone  (BUSPAR ) 15 MG tablet    Sig: Take 1 tablet (15 mg total) by mouth 2 (two) times daily.    Dispense:  180 tablet    Refill:  1   Referral Orders  No referral(s) requested today     Note is dictated utilizing voice recognition software. Although note has been proof read prior to signing, occasional typographical errors still can be missed. If any questions arise, please do not hesitate to call for verification.  Electronically signed by: Charlies Bellini, DO [facility] [place]

## 2024-09-22 ENCOUNTER — Ambulatory Visit: Payer: Self-pay | Admitting: Family Medicine

## 2024-09-22 LAB — HIV ANTIBODY (ROUTINE TESTING W REFLEX)
HIV 1&2 Ab, 4th Generation: NONREACTIVE
HIV FINAL INTERPRETATION: NEGATIVE

## 2024-09-22 LAB — URINE CYTOLOGY ANCILLARY ONLY
Chlamydia: NEGATIVE
Comment: NEGATIVE
Comment: NEGATIVE
Comment: NORMAL
Neisseria Gonorrhea: NEGATIVE
Trichomonas: NEGATIVE

## 2024-09-22 LAB — HEPATITIS C ANTIBODY: Hepatitis C Ab: NONREACTIVE

## 2024-11-18 ENCOUNTER — Encounter: Payer: Self-pay | Admitting: Family Medicine
